# Patient Record
Sex: Female | Born: 1960 | Race: White | Hispanic: No | Smoking: Former smoker
Health system: Southern US, Community
[De-identification: ages and names within clinical notes are randomized; demographics above are authoritative.]

## PROBLEM LIST (undated history)

## (undated) DIAGNOSIS — K219 Gastro-esophageal reflux disease without esophagitis: Secondary | ICD-10-CM

## (undated) DIAGNOSIS — T8859XA Other complications of anesthesia, initial encounter: Secondary | ICD-10-CM

## (undated) DIAGNOSIS — R112 Nausea with vomiting, unspecified: Secondary | ICD-10-CM

## (undated) DIAGNOSIS — E119 Type 2 diabetes mellitus without complications: Secondary | ICD-10-CM

## (undated) DIAGNOSIS — Z9889 Other specified postprocedural states: Secondary | ICD-10-CM

## (undated) DIAGNOSIS — T4145XA Adverse effect of unspecified anesthetic, initial encounter: Secondary | ICD-10-CM

## (undated) DIAGNOSIS — Z8719 Personal history of other diseases of the digestive system: Secondary | ICD-10-CM

## (undated) DIAGNOSIS — F419 Anxiety disorder, unspecified: Secondary | ICD-10-CM

## (undated) HISTORY — PX: LAPAROSCOPIC ROUX-EN-Y GASTRIC BYPASS WITH UPPER ENDOSCOPY AND REMOVAL OF LAP BAND: SHX6505

## (undated) HISTORY — PX: LAPAROSCOPIC GASTRIC BAND REMOVAL WITH LAPAROSCOPIC GASTRIC SLEEVE RESECTION: SHX6498

## (undated) HISTORY — PX: ABDOMINAL HYSTERECTOMY: SHX81

## (undated) HISTORY — PX: TONSILLECTOMY: SUR1361

## (undated) HISTORY — PX: CHOLECYSTECTOMY: SHX55

---

## 2017-11-12 ENCOUNTER — Other Ambulatory Visit
Admission: RE | Admit: 2017-11-12 | Discharge: 2017-11-12 | Disposition: A | Discharge status: Home / Self Care | Payer: BLUE CROSS/BLUE SHIELD | Source: Ambulatory Visit | Attending: Endocrinology | Admitting: Endocrinology

## 2017-11-12 DIAGNOSIS — E039 Hypothyroidism, unspecified: Secondary | ICD-10-CM | POA: Insufficient documentation

## 2017-11-12 DIAGNOSIS — Z5181 Encounter for therapeutic drug level monitoring: Secondary | ICD-10-CM | POA: Insufficient documentation

## 2017-11-12 DIAGNOSIS — E785 Hyperlipidemia, unspecified: Secondary | ICD-10-CM | POA: Insufficient documentation

## 2017-11-12 DIAGNOSIS — E1149 Type 2 diabetes mellitus with other diabetic neurological complication: Secondary | ICD-10-CM | POA: Insufficient documentation

## 2017-11-12 DIAGNOSIS — Z79899 Other long term (current) drug therapy: Secondary | ICD-10-CM | POA: Insufficient documentation

## 2017-11-12 LAB — T4, FREE: FREE T4: 1.43 ng/dL — AB (ref 0.61–1.12)

## 2017-11-12 LAB — LIPID PANEL
Cholesterol: 208 mg/dL — ABNORMAL HIGH (ref 0–200)
HDL: 59 mg/dL (ref >40)
LDL CALC: 122 mg/dL — AB (ref 0–99)
TRIGLYCERIDES: 135 mg/dL (ref <150)
Total CHOL/HDL Ratio: 3.5 RATIO
VLDL: 27 mg/dL (ref 0–40)

## 2017-11-12 LAB — HEMOGLOBIN A1C
Hgb A1c MFr Bld: 6.1 % — ABNORMAL HIGH (ref 4.8–5.6)
Mean Plasma Glucose: 128.37 mg/dL

## 2017-11-12 LAB — TSH: TSH: 0.624 u[IU]/mL (ref 0.350–4.500)

## 2017-12-12 ENCOUNTER — Other Ambulatory Visit: Payer: Self-pay | Admitting: Family Medicine

## 2017-12-12 DIAGNOSIS — Z1231 Encounter for screening mammogram for malignant neoplasm of breast: Secondary | ICD-10-CM

## 2018-01-02 ENCOUNTER — Ambulatory Visit: Payer: BLUE CROSS/BLUE SHIELD

## 2018-01-07 ENCOUNTER — Ambulatory Visit
Admission: RE | Admit: 2018-01-07 | Discharge: 2018-01-07 | Disposition: A | Discharge status: Home / Self Care | Payer: BLUE CROSS/BLUE SHIELD | Source: Ambulatory Visit | Attending: Family Medicine | Admitting: Family Medicine

## 2018-01-07 ENCOUNTER — Encounter (HOSPITAL_COMMUNITY): Payer: Self-pay

## 2018-01-07 DIAGNOSIS — Z1231 Encounter for screening mammogram for malignant neoplasm of breast: Secondary | ICD-10-CM | POA: Diagnosis not present

## 2018-01-15 ENCOUNTER — Inpatient Hospital Stay
Admission: RE | Admit: 2018-01-15 | Discharge: 2018-01-15 | Disposition: A | Discharge status: Home / Self Care | Payer: Self-pay | Source: Ambulatory Visit | Attending: *Deleted | Admitting: *Deleted

## 2018-01-15 ENCOUNTER — Other Ambulatory Visit: Payer: Self-pay | Admitting: *Deleted

## 2018-01-15 DIAGNOSIS — Z9289 Personal history of other medical treatment: Secondary | ICD-10-CM

## 2018-01-25 ENCOUNTER — Other Ambulatory Visit: Payer: Self-pay | Admitting: Surgery

## 2018-01-25 DIAGNOSIS — Z903 Acquired absence of stomach [part of]: Secondary | ICD-10-CM

## 2018-02-01 ENCOUNTER — Ambulatory Visit
Admission: RE | Admit: 2018-02-01 | Discharge: 2018-02-01 | Disposition: A | Discharge status: Home / Self Care | Payer: BLUE CROSS/BLUE SHIELD | Source: Ambulatory Visit | Attending: Surgery | Admitting: Surgery

## 2018-02-01 DIAGNOSIS — Z903 Acquired absence of stomach [part of]: Secondary | ICD-10-CM

## 2018-04-23 ENCOUNTER — Other Ambulatory Visit: Payer: Self-pay | Admitting: Surgery

## 2018-04-23 DIAGNOSIS — R112 Nausea with vomiting, unspecified: Secondary | ICD-10-CM

## 2018-04-24 ENCOUNTER — Other Ambulatory Visit: Payer: Self-pay | Admitting: Surgery

## 2018-04-24 DIAGNOSIS — R112 Nausea with vomiting, unspecified: Secondary | ICD-10-CM

## 2018-04-30 ENCOUNTER — Ambulatory Visit
Admission: RE | Admit: 2018-04-30 | Discharge: 2018-04-30 | Disposition: A | Discharge status: Home / Self Care | Payer: BLUE CROSS/BLUE SHIELD | Source: Ambulatory Visit | Attending: Surgery | Admitting: Surgery

## 2018-04-30 DIAGNOSIS — K5730 Diverticulosis of large intestine without perforation or abscess without bleeding: Secondary | ICD-10-CM | POA: Insufficient documentation

## 2018-04-30 DIAGNOSIS — K449 Diaphragmatic hernia without obstruction or gangrene: Secondary | ICD-10-CM | POA: Insufficient documentation

## 2018-04-30 DIAGNOSIS — K760 Fatty (change of) liver, not elsewhere classified: Secondary | ICD-10-CM | POA: Insufficient documentation

## 2018-04-30 DIAGNOSIS — R112 Nausea with vomiting, unspecified: Secondary | ICD-10-CM | POA: Diagnosis present

## 2018-04-30 DIAGNOSIS — R590 Localized enlarged lymph nodes: Secondary | ICD-10-CM | POA: Diagnosis not present

## 2018-04-30 HISTORY — DX: Type 2 diabetes mellitus without complications: E11.9

## 2018-04-30 LAB — POCT I-STAT CREATININE: Creatinine, Ser: 0.8 mg/dL (ref 0.44–1.00)

## 2018-04-30 MED ORDER — IOPAMIDOL (ISOVUE-300) INJECTION 61%
100.0000 mL | Freq: Once | INTRAVENOUS | Status: AC | PRN
  Administered 2018-04-30: 100 mL via INTRAVENOUS

## 2018-05-17 ENCOUNTER — Other Ambulatory Visit (HOSPITAL_COMMUNITY): Payer: Self-pay | Admitting: Surgery

## 2018-05-17 ENCOUNTER — Other Ambulatory Visit: Payer: Self-pay | Admitting: Surgery

## 2018-05-17 DIAGNOSIS — Z903 Acquired absence of stomach [part of]: Secondary | ICD-10-CM

## 2018-05-27 ENCOUNTER — Ambulatory Visit
Admission: RE | Admit: 2018-05-27 | Discharge: 2018-05-27 | Disposition: A | Discharge status: Home / Self Care | Payer: BLUE CROSS/BLUE SHIELD | Source: Ambulatory Visit | Attending: Surgery | Admitting: Surgery

## 2018-05-27 DIAGNOSIS — Z903 Acquired absence of stomach [part of]: Secondary | ICD-10-CM | POA: Insufficient documentation

## 2018-05-27 DIAGNOSIS — R590 Localized enlarged lymph nodes: Secondary | ICD-10-CM | POA: Insufficient documentation

## 2018-05-27 MED ORDER — IOPAMIDOL (ISOVUE-300) INJECTION 61%
75.0000 mL | Freq: Once | INTRAVENOUS | Status: AC | PRN
  Administered 2018-05-27: 150 mL via INTRAVENOUS

## 2018-07-02 NOTE — H&P (Signed)
Chief Complaint:  Chronic nausea and/or vomiting  History of Present Illness:  Sierra Hall is an 57 y.o. female who had a lapband in 2007 with revision in 2009 and then a conversion to a sleeve in 2013 done elsewhere.  She has severe GER.  She has had a lap chole.  Presents for upper endoscopy.    Past Medical History:  Diagnosis Date  . Diabetes mellitus without complication ([place])     No past surgical history on file.  No current facility-administered medications for this encounter.    Current Outpatient Medications  Medication Sig Dispense Refill  . Ascorbic Acid (VITAMIN C) 100 MG tablet Take 100 mg by mouth daily.    Marland Kitchen b complex vitamins tablet Take 1 tablet by mouth daily.    . Biotin 1 MG CAPS Take 10,000 Units by mouth daily.    Marland Kitchen CARAFATE 1 GM/10ML suspension Take 10 mLs by mouth 2 (two) times daily.   0  . Cholecalciferol (VITAMIN D-1000 MAX ST) 1000 units tablet Take 10,000 Units by mouth daily.    . fenofibrate 160 MG tablet Take 160 mg by mouth daily.    . ferrous sulfate 325 (65 FE) MG EC tablet Take 325 mg by mouth daily.    . furosemide (LASIX) 20 MG tablet Take 20 mg by mouth daily as needed for edema.    Marland Kitchen glimepiride (AMARYL) 2 MG tablet Take 2 mg by mouth daily.    Marland Kitchen levothyroxine (SYNTHROID, LEVOTHROID) 137 MCG tablet Take 137 mcg by mouth daily before breakfast.    . magnesium oxide (MAG-OX) 400 MG tablet Take 400 mg by mouth daily.    . metFORMIN (GLUCOPHAGE-XR) 500 MG 24 hr tablet Take 500 mg by mouth 2 (two) times daily.    . Omega-3 1000 MG CAPS Take 1,000 mg by mouth 3 (three) times daily.    Marland Kitchen omeprazole (PRILOSEC) 40 MG capsule Take 40 mg by mouth 2 (two) times daily.    . Potassium 99 MG TABS Take 99 mg by mouth daily.    . vitamin B-12 (CYANOCOBALAMIN) 1000 MCG tablet Take 1,000 mcg by mouth daily.     Dilaudid [hydromorphone hcl]; Latex; Penicillin g; and Tape Family History  Problem Relation Age of Onset  . Breast cancer Neg Hx    Social  History:   has no tobacco, alcohol, and drug history on file.   REVIEW OF SYSTEMS : Negative   Physical Exam:   There were no vitals taken for this visit. There is no height or weight on file to calculate BMI.  Gen:  WDWN WF NAD  Neurological: Alert and oriented to person, place, and time. Motor and sensory function is grossly intact  Head: Normocephalic and atraumatic.  Eyes: Conjunctivae are normal. Pupils are equal, round, and reactive to light. No scleral icterus.  Neck: Normal range of motion. Neck supple. No tracheal deviation or thyromegaly present.  Cardiovascular:  SR without murmurs or gallops.  No carotid bruits Breast:  Not examined Respiratory: Effort normal.  No respiratory distress. No chest wall tenderness. Breath sounds normal.  No wheezes, rales or rhonchi.  Abdomen:  Non tender GU:  Not examined Musculoskeletal: Normal range of motion. Extremities are nontender. No cyanosis, edema or clubbing noted Lymphadenopathy: No cervical, preauricular, postauricular or axillary adenopathy is present Skin: Skin is warm and dry. No rash noted. No diaphoresis. No erythema. No pallor. Pscyh: Normal mood and affect. Behavior is normal. Judgment and thought content normal.  LABORATORY RESULTS: No results found for this or any previous visit (from the past 48 hour(s)).   RADIOLOGY RESULTS: No results found.  Problem List: There are no active problems to display for this patient.   Assessment & Plan: GER after sleeve for upper endoscopy    Matt B. Hassell Done, MD, [facility] [phone] beeper [phone]  07/02/2018 3:38 PM

## 2018-07-05 ENCOUNTER — Other Ambulatory Visit: Payer: Self-pay

## 2018-07-05 ENCOUNTER — Ambulatory Visit (HOSPITAL_COMMUNITY)
Admission: RE | Admit: 2018-07-05 | Discharge: 2018-07-05 | Disposition: A | Discharge status: Home / Self Care | Payer: BLUE CROSS/BLUE SHIELD | Source: Ambulatory Visit | Attending: Surgery | Admitting: Surgery

## 2018-07-05 ENCOUNTER — Ambulatory Visit (HOSPITAL_COMMUNITY): Payer: BLUE CROSS/BLUE SHIELD | Admitting: Anesthesiology

## 2018-07-05 ENCOUNTER — Encounter (HOSPITAL_COMMUNITY)
Admission: RE | Disposition: A | Discharge status: Home / Self Care | Payer: Self-pay | Source: Ambulatory Visit | Attending: Surgery

## 2018-07-05 ENCOUNTER — Encounter (HOSPITAL_COMMUNITY): Payer: Self-pay | Admitting: *Deleted

## 2018-07-05 DIAGNOSIS — E119 Type 2 diabetes mellitus without complications: Secondary | ICD-10-CM | POA: Insufficient documentation

## 2018-07-05 DIAGNOSIS — Z87891 Personal history of nicotine dependence: Secondary | ICD-10-CM | POA: Insufficient documentation

## 2018-07-05 DIAGNOSIS — R112 Nausea with vomiting, unspecified: Secondary | ICD-10-CM | POA: Diagnosis not present

## 2018-07-05 DIAGNOSIS — Z7984 Long term (current) use of oral hypoglycemic drugs: Secondary | ICD-10-CM | POA: Diagnosis not present

## 2018-07-05 DIAGNOSIS — K219 Gastro-esophageal reflux disease without esophagitis: Secondary | ICD-10-CM | POA: Insufficient documentation

## 2018-07-05 DIAGNOSIS — Z79890 Hormone replacement therapy (postmenopausal): Secondary | ICD-10-CM | POA: Diagnosis not present

## 2018-07-05 DIAGNOSIS — E039 Hypothyroidism, unspecified: Secondary | ICD-10-CM | POA: Diagnosis not present

## 2018-07-05 DIAGNOSIS — Z9884 Bariatric surgery status: Secondary | ICD-10-CM | POA: Diagnosis not present

## 2018-07-05 HISTORY — PX: ESOPHAGOGASTRODUODENOSCOPY (EGD) WITH PROPOFOL: SHX5813

## 2018-07-05 HISTORY — DX: Adverse effect of unspecified anesthetic, initial encounter: T41.45XA

## 2018-07-05 HISTORY — DX: Other complications of anesthesia, initial encounter: T88.59XA

## 2018-07-05 LAB — GLUCOSE, CAPILLARY: Glucose-Capillary: 104 mg/dL — ABNORMAL HIGH (ref 70–99)

## 2018-07-05 SURGERY — ESOPHAGOGASTRODUODENOSCOPY (EGD) WITH PROPOFOL
Anesthesia: Monitor Anesthesia Care

## 2018-07-05 MED ORDER — PROPOFOL 500 MG/50ML IV EMUL
INTRAVENOUS | Status: DC | PRN
  Administered 2018-07-05: 30 mg via INTRAVENOUS
  Administered 2018-07-05: 20 mg via INTRAVENOUS

## 2018-07-05 MED ORDER — MIDAZOLAM HCL 2 MG/2ML IJ SOLN
INTRAMUSCULAR | Status: AC
  Filled 2018-07-05: qty 2

## 2018-07-05 MED ORDER — ONDANSETRON HCL 4 MG/2ML IJ SOLN
INTRAMUSCULAR | Status: DC | PRN
  Administered 2018-07-05: 4 mg via INTRAVENOUS

## 2018-07-05 MED ORDER — LACTATED RINGERS IV SOLN
INTRAVENOUS | Status: DC
  Administered 2018-07-05: 07:00:00 via INTRAVENOUS

## 2018-07-05 MED ORDER — PROPOFOL 10 MG/ML IV BOLUS
INTRAVENOUS | Status: AC
  Filled 2018-07-05: qty 60

## 2018-07-05 MED ORDER — SCOPOLAMINE 1 MG/3DAYS TD PT72
1.0000 | MEDICATED_PATCH | TRANSDERMAL | Status: DC
  Administered 2018-07-05: 1.5 mg via TRANSDERMAL

## 2018-07-05 MED ORDER — MIDAZOLAM HCL 5 MG/5ML IJ SOLN
INTRAMUSCULAR | Status: DC | PRN
  Administered 2018-07-05: 2 mg via INTRAVENOUS

## 2018-07-05 MED ORDER — SCOPOLAMINE 1 MG/3DAYS TD PT72
MEDICATED_PATCH | TRANSDERMAL | Status: AC
  Filled 2018-07-05: qty 1

## 2018-07-05 MED ORDER — PROPOFOL 500 MG/50ML IV EMUL
INTRAVENOUS | Status: DC | PRN
  Administered 2018-07-05: 100 ug/kg/min via INTRAVENOUS

## 2018-07-05 MED ORDER — CHLORHEXIDINE GLUCONATE CLOTH 2 % EX PADS: 6.0000 | MEDICATED_PAD | Freq: Once | CUTANEOUS | Status: DC

## 2018-07-05 NOTE — Transfer of Care (Signed)
Immediate Anesthesia Transfer of Care Note  Patient: Sierra Hall  Procedure(s) Performed: Procedure(s): ESOPHAGOGASTRODUODENOSCOPY (EGD) WITH PROPOFOL (N/A)  Patient Location: PACU  Anesthesia Type:MAC  Level of Consciousness:  sedated, patient cooperative and responds to stimulation  Airway & Oxygen Therapy:Patient Spontanous Breathing and Patient connected to face mask oxgen  Post-op Assessment:  Report given to PACU RN and Post -op Vital signs reviewed and stable  Post vital signs:  Reviewed and stable  Last Vitals:  Vitals:   07/05/18 0629  BP: 116/60  Pulse: 60  Resp: 12  Temp: 36.6 C  SpO2: 389%    Complications: No apparent anesthesia complications

## 2018-07-05 NOTE — Anesthesia Postprocedure Evaluation (Signed)
Anesthesia Post Note  Patient: Sierra Hall  Procedure(s) Performed: ESOPHAGOGASTRODUODENOSCOPY (EGD) WITH PROPOFOL (N/A )     Patient location during evaluation: Endoscopy Anesthesia Type: MAC Level of consciousness: awake Pain management: pain level controlled Vital Signs Assessment: post-procedure vital signs reviewed and stable Respiratory status: spontaneous breathing Cardiovascular status: stable Postop Assessment: no apparent nausea or vomiting Anesthetic complications: no    Last Vitals:  Vitals:   07/05/18 0810 07/05/18 0820  BP: (!) 95/51 (!) 107/50  Pulse: (!) 56 (!) 52  Resp: 19 14  Temp:    SpO2: 98% 100%    Last Pain:  Vitals:   07/05/18 0629  TempSrc: Oral  PainSc:    Pain Goal:                 Atanacio Melnyk Barron Alvine

## 2018-07-05 NOTE — Anesthesia Preprocedure Evaluation (Addendum)
Anesthesia Evaluation    History of Anesthesia Complications (+) PONV, AWARENESS UNDER ANESTHESIA and history of anesthetic complications  Airway Mallampati: I       Dental no notable dental hx. (+) Teeth Intact   Pulmonary former smoker,    Pulmonary exam normal breath sounds clear to auscultation       Cardiovascular Normal cardiovascular exam Rhythm:Regular Rate:Normal     Neuro/Psych    GI/Hepatic negative GI ROS, Neg liver ROS,   Endo/Other  diabetes, Type 2, Oral Hypoglycemic AgentsHypothyroidism   Renal/GU negative Renal ROS     Musculoskeletal   Abdominal Normal abdominal exam  (+)   Peds  Hematology   Anesthesia Other Findings   Reproductive/Obstetrics                            Anesthesia Physical Anesthesia Plan  ASA: II  Anesthesia Plan: MAC   Post-op Pain Management:    Induction:   PONV Risk Score and Plan: Ondansetron, Dexamethasone and Scopolamine patch - Pre-op  Airway Management Planned: Natural Airway, Nasal Cannula and Simple Face Mask  Additional Equipment:   Intra-op Plan:   Post-operative Plan:   Informed Consent: I have reviewed the patients History and Physical, chart, labs and discussed the procedure including the risks, benefits and alternatives for the proposed anesthesia with the patient or authorized representative who has indicated his/her understanding and acceptance.   Dental advisory given  Plan Discussed with: CRNA  Anesthesia Plan Comments:         Anesthesia Quick Evaluation

## 2018-07-05 NOTE — Interval H&P Note (Signed)
History and Physical Interval Note:  07/05/2018 7:32 AM  Sierra Hall  has presented today for surgery, with the diagnosis of POST SLEEVE HIATAL HERNIA AND WALL THICKENING  The various methods of treatment have been discussed with the patient and family. After consideration of risks, benefits and other options for treatment, the patient has consented to  Procedure(s): ESOPHAGOGASTRODUODENOSCOPY (EGD) WITH PROPOFOL (N/A) as a surgical intervention .  The patient's history has been reviewed, patient examined, no change in status, stable for surgery.  I have reviewed the patient's chart and labs.  Questions were answered to the patient's satisfaction.     [name]

## 2018-07-05 NOTE — Discharge Instructions (Signed)
Esophagogastroduodenoscopy, Care After °Refer to this sheet in the next few weeks. These instructions provide you with information about caring for yourself after your procedure. Your health care provider may also give you more specific instructions. Your treatment has been planned according to current medical practices, but problems sometimes occur. Call your health care provider if you have any problems or questions after your procedure. °What can I expect after the procedure? °After the procedure, it is common to have: °· A sore throat. °· Nausea. °· Bloating. °· Dizziness. °· Fatigue. ° °Follow these instructions at home: °· Do not eat or drink anything until the numbing medicine (local anesthetic) has worn off and your gag reflex has returned. You will know that the local anesthetic has worn off when you can swallow comfortably. °· Do not drive for 24 hours if you received a medicine to help you relax (sedative). °· If your health care provider took a tissue sample for testing during the procedure, make sure to get your test results. This is your responsibility. Ask your health care provider or the department performing the test when your results will be ready. °· Keep all follow-up visits as told by your health care provider. This is important. °Contact a health care provider if: °· You cannot stop coughing. °· You are not urinating. °· You are urinating less than usual. °Get help right away if: °· You have trouble swallowing. °· You cannot eat or drink. °· You have throat or chest pain that gets worse. °· You are dizzy or light-headed. °· You faint. °· You have nausea or vomiting. °· You have chills. °· You have a fever. °· You have severe abdominal pain. °· You have black, tarry, or bloody stools. °This information is not intended to replace advice given to you by your health care provider. Make sure you discuss any questions you have with your health care provider. °Document Released: 10/09/2012 Document  Revised: 03/30/2016 Document Reviewed: 09/16/2015 °Elsevier Interactive Patient Education © 2018 [employer] ° °

## 2018-07-05 NOTE — Op Note (Signed)
Sierra Hall  10/25/1961 05 July 2018   PCP:  Derinda Late, MD   Surgeon: Kaylyn Lim, MD, FACS  Asst:  none  Anes:  IV sedation in endoscopy  Preop Dx: Post prior bariatric procedures (lapband x2 and conversion to sleeve gastrectomy in [place]) now with GER and nausea/vomiting Postop Dx: No hiatal hernia noted;  Some retained food in proximal sleeve;  No esophagitis, normal appearing mucosa in the sleeve  Procedure: Esophagogastrostomy with biopsy Location Surgery: [facility] Endo 4 Complications: none  EBL:   none cc  Drains: none  Description of Procedure:  The patient was taken to Endo 4 . The endoscope was inserted into the mouth with the bite guard in place and passed into the proximal esophagus without difficulty.  Initial inspection revealed small amount of amorphous food in the proximal sleeve.  The EG junction at 40 cm did not have a hiatal hernia noted.  The mucosa did not appear thickened.  The sleeve appeared cylindrical and easily passed to the antrum which did have some red stria.  Clo bx done in the antrum.  Motility noted to be normal.  The scope was withdrawn to the proximal sleeve where two biopsies were taken of the mucosa ( in light of report concerning thickening on CT).  Minimal bleeding after biopsy.  The scope was then withdrawn and she tolerated the procedure well.     Matt B. Hassell Done, [place], [facility], [place]

## 2018-07-06 LAB — CLOTEST (H. PYLORI), BIOPSY: HELICOBACTER SCREEN: NEGATIVE

## 2018-07-09 ENCOUNTER — Encounter (HOSPITAL_COMMUNITY): Payer: Self-pay | Admitting: Surgery

## 2018-07-31 ENCOUNTER — Ambulatory Visit: Payer: Self-pay | Admitting: Surgery

## 2018-11-14 ENCOUNTER — Ambulatory Visit (HOSPITAL_COMMUNITY): Payer: BLUE CROSS/BLUE SHIELD | Admitting: Anesthesiology

## 2018-11-14 ENCOUNTER — Other Ambulatory Visit: Payer: Self-pay

## 2018-11-14 ENCOUNTER — Ambulatory Visit (HOSPITAL_COMMUNITY)
Admission: RE | Admit: 2018-11-14 | Discharge: 2018-11-14 | Disposition: A | Discharge status: Home / Self Care | Payer: BLUE CROSS/BLUE SHIELD | Source: Ambulatory Visit | Attending: Surgery | Admitting: Surgery

## 2018-11-14 ENCOUNTER — Encounter (HOSPITAL_COMMUNITY)
Admission: RE | Disposition: A | Discharge status: Home / Self Care | Payer: Self-pay | Source: Ambulatory Visit | Attending: Surgery

## 2018-11-14 DIAGNOSIS — K644 Residual hemorrhoidal skin tags: Secondary | ICD-10-CM | POA: Diagnosis not present

## 2018-11-14 DIAGNOSIS — Z9884 Bariatric surgery status: Secondary | ICD-10-CM | POA: Insufficient documentation

## 2018-11-14 DIAGNOSIS — K219 Gastro-esophageal reflux disease without esophagitis: Secondary | ICD-10-CM | POA: Diagnosis not present

## 2018-11-14 DIAGNOSIS — Z86010 Personal history of colonic polyps: Secondary | ICD-10-CM | POA: Insufficient documentation

## 2018-11-14 DIAGNOSIS — Z885 Allergy status to narcotic agent status: Secondary | ICD-10-CM | POA: Diagnosis not present

## 2018-11-14 DIAGNOSIS — E119 Type 2 diabetes mellitus without complications: Secondary | ICD-10-CM | POA: Insufficient documentation

## 2018-11-14 DIAGNOSIS — Z880 Allergy status to penicillin: Secondary | ICD-10-CM | POA: Insufficient documentation

## 2018-11-14 DIAGNOSIS — K6389 Other specified diseases of intestine: Secondary | ICD-10-CM | POA: Insufficient documentation

## 2018-11-14 DIAGNOSIS — Z87891 Personal history of nicotine dependence: Secondary | ICD-10-CM | POA: Diagnosis not present

## 2018-11-14 DIAGNOSIS — D125 Benign neoplasm of sigmoid colon: Secondary | ICD-10-CM | POA: Insufficient documentation

## 2018-11-14 DIAGNOSIS — Z800 Family history of malignant neoplasm of digestive organs: Secondary | ICD-10-CM | POA: Insufficient documentation

## 2018-11-14 DIAGNOSIS — K5730 Diverticulosis of large intestine without perforation or abscess without bleeding: Secondary | ICD-10-CM | POA: Insufficient documentation

## 2018-11-14 HISTORY — PX: COLONOSCOPY: SHX5424

## 2018-11-14 HISTORY — PX: BIOPSY: SHX5522

## 2018-11-14 HISTORY — PX: POLYPECTOMY: SHX5525

## 2018-11-14 LAB — GLUCOSE, CAPILLARY: Glucose-Capillary: 112 mg/dL — ABNORMAL HIGH (ref 70–99)

## 2018-11-14 SURGERY — COLONOSCOPY
Anesthesia: Monitor Anesthesia Care

## 2018-11-14 MED ORDER — PROPOFOL 10 MG/ML IV BOLUS
INTRAVENOUS | Status: AC
  Filled 2018-11-14: qty 20

## 2018-11-14 MED ORDER — PROPOFOL 10 MG/ML IV BOLUS
INTRAVENOUS | Status: AC
  Filled 2018-11-14: qty 40

## 2018-11-14 MED ORDER — LACTATED RINGERS IV SOLN
INTRAVENOUS | Status: DC
  Administered 2018-11-14 (×2): via INTRAVENOUS

## 2018-11-14 MED ORDER — PROPOFOL 10 MG/ML IV BOLUS
INTRAVENOUS | Status: DC | PRN
  Administered 2018-11-14: 20 mg via INTRAVENOUS
  Administered 2018-11-14: 40 mg via INTRAVENOUS
  Administered 2018-11-14 (×4): 20 mg via INTRAVENOUS

## 2018-11-14 MED ORDER — SODIUM CHLORIDE 0.9 % IV SOLN: INTRAVENOUS | Status: DC

## 2018-11-14 MED ORDER — PROPOFOL 500 MG/50ML IV EMUL
INTRAVENOUS | Status: DC | PRN
  Administered 2018-11-14: 100 ug/kg/min via INTRAVENOUS

## 2018-11-14 NOTE — Anesthesia Postprocedure Evaluation (Signed)
Anesthesia Post Note  Patient: Sierra Hall  Procedure(s) Performed: COLONOSCOPY (N/A ) POLYPECTOMY BIOPSY     Patient location during evaluation: PACU Anesthesia Type: MAC Level of consciousness: awake and alert Pain management: pain level controlled Vital Signs Assessment: post-procedure vital signs reviewed and stable Respiratory status: spontaneous breathing, nonlabored ventilation, respiratory function stable and patient connected to nasal cannula oxygen Cardiovascular status: stable and blood pressure returned to baseline Postop Assessment: no apparent nausea or vomiting Anesthetic complications: no    Last Vitals:  Vitals:   11/14/18 0931 11/14/18 0940  BP: (!) 136/51 (!) 117/55  Pulse: (!) 55 62  Resp: 14 19  Temp: 36.4 C   SpO2: 99% 99%    Last Pain:  Vitals:   11/14/18 0940  TempSrc:   PainSc: 0-No pain                 Andrey Mccaskill S

## 2018-11-14 NOTE — Anesthesia Preprocedure Evaluation (Signed)
Anesthesia Evaluation  Patient identified by MRN, date of birth, ID band Patient awake    Reviewed: Allergy & Precautions, NPO status , Patient's Chart, lab work & pertinent test results  Airway Mallampati: II  TM Distance: >3 FB Neck ROM: Full    Dental no notable dental hx.    Pulmonary neg pulmonary ROS, former smoker,    Pulmonary exam normal breath sounds clear to auscultation       Cardiovascular negative cardio ROS Normal cardiovascular exam Rhythm:Regular Rate:Normal     Neuro/Psych negative neurological ROS  negative psych ROS   GI/Hepatic negative GI ROS, Neg liver ROS,   Endo/Other  diabetes  Renal/GU negative Renal ROS  negative genitourinary   Musculoskeletal negative musculoskeletal ROS (+)   Abdominal   Peds negative pediatric ROS (+)  Hematology negative hematology ROS (+)   Anesthesia Other Findings   Reproductive/Obstetrics negative OB ROS                             Anesthesia Physical Anesthesia Plan  ASA: II  Anesthesia Plan: MAC   Post-op Pain Management:    Induction: Intravenous  PONV Risk Score and Plan:   Airway Management Planned: Simple Face Mask  Additional Equipment:   Intra-op Plan:   Post-operative Plan:   Informed Consent: I have reviewed the patients History and Physical, chart, labs and discussed the procedure including the risks, benefits and alternatives for the proposed anesthesia with the patient or authorized representative who has indicated his/her understanding and acceptance.   Dental advisory given  Plan Discussed with: CRNA and Surgeon  Anesthesia Plan Comments:         Anesthesia Quick Evaluation

## 2018-11-14 NOTE — Op Note (Signed)
[facility] Patient Name: Sierra Hall Procedure Date: 11/14/2018 MRN: [id number] Attending MD: Ileana Roup MD, MD Date of Birth: 18-Sep-1961 CSN: [id number] Age: 58 Admit Type: Outpatient Procedure:                Colonoscopy Indications:              Generalized abdominal pain, Personal history of                            colonic polyps, Change in stool caliber Providers:                Sharon Mt. Roberto Hlavaty MD, MD, Elmer Ramp. Tilden Dome, RN,                            Cherylynn Ridges, Technician, Anne Fu CRNA,                            CRNA Referring MD:              Medicines:                Monitored Anesthesia Care Complications:            No immediate complications. Estimated Blood Loss:     Estimated blood loss was minimal. Procedure:                Pre-Anesthesia Assessment:                           - Prior to the procedure, a History and Physical                            was performed, and patient medications, allergies                            and sensitivities were reviewed. The patient's                            tolerance of previous anesthesia was reviewed.                           - The risks and benefits of the procedure and the                            sedation options and risks were discussed with the                            patient. All questions were answered and informed                            consent was obtained.                           - Patient identification and proposed procedure                            were verified prior to the  procedure by the                            physician, the nurse, the anesthesiologist and the                            anesthetist. The procedure was verified in the                            pre-procedure area in the endoscopy suite.                           - ASA Grade Assessment: II - A patient with mild                            systemic disease.    - The anesthesia plan was to use monitored                            anesthesia care (MAC).                           After obtaining informed consent, the colonoscope                            was passed under direct vision. Throughout the                            procedure, the patient's blood pressure, pulse, and                            oxygen saturations were monitored continuously. The                            CF-HQ190L ([id number]) Olympus Adult Colonoscope was                            introduced through the anus and advanced to the the                            cecum, identified by the appendiceal orifice, IC                            valve and transillumination. The colonoscopy was                            somewhat difficult due to a redundant colon with                            significant looping of the sigmoid colon.                            Successful completion of the procedure was aided by  applying gentle abdominal pressure. The patient                            tolerated the procedure well. The quality of the                            bowel preparation was adequate. Scope In: 8:13:32 AM Scope Out: 9:23:32 AM Scope Withdrawal Time: 0 hours 26 minutes 16 seconds  Total Procedure Duration: 1 hour 10 minutes 0 seconds  Findings:      Hemorrhoids were found on perianal exam.      Skin tags were found on perianal exam.      Multiple small and large-mouthed diverticula were found in the sigmoid       colon.      An area of mildly congested mucosa was found throughout in the sigmoid       colon.      A 4 mm polyp was found in the sigmoid colon. The polyp was semi-sessile.       The polyp was removed with a cold snare. Resection and retrieval were       complete. Verification of patient identification for the specimen was       done using the patient's name, birth date and medical record number.       Estimated blood loss was  minimal.      Multiple biopsies were obtained with cold forceps for histology randomly       in the sigmoid colon, in the descending colon, in the transverse colon       and in the ascending colon. Estimated blood loss: none.      The exam was otherwise without abnormality on direct and retroflexion       views. Impression:               - Hemorrhoids found on perianal exam.                           - Perianal skin tags found on perianal exam.                           - Diverticulosis in the sigmoid colon.                           - Congested mucosa in the sigmoid colon.                           - One 4 mm polyp in the sigmoid colon, removed with                            a cold snare. Resected and retrieved.                           - The examination was otherwise normal on direct                            and retroflexion views.                           -  Multiple biopsies were obtained in the sigmoid                            colon, in the descending colon, in the transverse                            colon and in the ascending colon. Moderate Sedation:      Not Applicable - Patient had care per Anesthesia. Recommendation:           - Discharge patient to home.                           - High fiber diet indefinitely.                           - No aspirin, ibuprofen, naproxen, or other                            non-steroidal anti-inflammatory drugs for 10 days                            after polyp removal.                           - Await pathology results.                           - Repeat colonoscopy in 3 - 5 years for                            surveillance based on pathology results.                           - Return to referring physician at appointment to                            be scheduled. Procedure Code(s):        --- Professional ---                           [phone], Colonoscopy, flexible; with removal of                            tumor(s), polyp(s), or  other lesion(s) by snare                            technique                           [id number], 67, Colonoscopy, flexible; with biopsy,                            single or multiple Diagnosis Code(s):        --- Professional ---  K63.89, Other specified diseases of intestine                           K64.9, Unspecified hemorrhoids                           D12.5, Benign neoplasm of sigmoid colon                           K64.4, Residual hemorrhoidal skin tags                           R10.84, Generalized abdominal pain                           Z86.010, Personal history of colonic polyps                           R19.5, Other fecal abnormalities                           K57.30, Diverticulosis of large intestine without                            perforation or abscess without bleeding CPT copyright 2018 American [employer] Association. All rights reserved. The codes documented in this report are preliminary and upon coder review may  be revised to meet current compliance requirements. Nadeen Landau, MD Ileana Roup MD, MD 11/14/2018 9:34:34 AM This report has been signed electronically. Number of Addenda: 0

## 2018-11-14 NOTE — Transfer of Care (Signed)
Immediate Anesthesia Transfer of Care Note  Patient: Sierra Hall  Procedure(s) Performed: Procedure(s): COLONOSCOPY (N/A) POLYPECTOMY BIOPSY  Patient Location: PACU  Anesthesia Type:MAC  Level of Consciousness:  sedated, patient cooperative and responds to stimulation  Airway & Oxygen Therapy:Patient Spontanous Breathing and Patient connected to face mask oxgen  Post-op Assessment:  Report given to PACU RN and Post -op Vital signs reviewed and stable  Post vital signs:  Reviewed and stable  Last Vitals:  Vitals:   11/14/18 0725  BP: 115/65  Pulse: 62  Resp: 13  Temp: 36.6 C  SpO2: 073%    Complications: No apparent anesthesia complications

## 2018-11-14 NOTE — H&P (Addendum)
CC: Here today for diagnostic colonoscopy  HPI: EVALENA FUJII is an 58 y.o. female with hx of DM, morbid obesity (now s/p sleeve gastrectomy in [place]) presents for diagnostic colonoscopy. She has been followed in the clinic by my partner Dr. Hassell Done. She has a hx of sleeve gastrectomy with what sounds like hiatal hernia repair and abdominoplasty in [place]. She had a good response with regards to weight loss. She subsequently moved here and has been following with Dr. Hassell Done for her bariatric care. She reports a 1-15yr hx of intermittent bloating and narrow caliber stool. She has hx of GERD as well. She denies any blood in her stool. She was referred for colonoscopy given her symptoms  She underwent EGD with Dr. Hassell Done 06/2018 - relatively normal - biopsies returned benign gastric tissue without H. pylori  She reports having had a colonoscopy ~65yrs ago for personal hx of polyps but being told this was normal - no polyps removed. She believes she had her first colonoscopy ~45-47yo as she has a sister that was diagnosed with colon cancer in her 49s at that time. She reports having been told she had some small polyps that were removed at her first scope.   Past Medical History:  Diagnosis Date  . Complication of anesthesia    woke up during surgery   . Diabetes mellitus without complication [facility])     Past Surgical History:  Procedure Laterality Date  . ESOPHAGOGASTRODUODENOSCOPY (EGD) WITH PROPOFOL N/A 07/05/2018   Procedure: ESOPHAGOGASTRODUODENOSCOPY (EGD) WITH PROPOFOL;  Surgeon: Johnathan Hausen, MD;  Location: [facility];  Service: General;  Laterality: N/A;    Family History  Problem Relation Age of Onset  . Breast cancer Neg Hx   Sister with colon cancer in her 67s; reports her and all her sxs have been screened and not had cancer. Denies any other FHx of malignancy.  Social:  reports that she has quit smoking. She has never used smokeless tobacco. She reports current alcohol use.  She reports that she does not use drugs.  Allergies:  Allergies  Allergen Reactions  . Dilaudid [Hydromorphone Hcl] Nausea Only  . Latex Rash  . Penicillin G Rash and Other (See Comments)    Has patient had a PCN reaction causing immediate rash, facial/tongue/throat swelling, SOB or lightheadedness with hypotension: Yes Has patient had a PCN reaction causing severe rash involving mucus membranes or skin necrosis: No Has patient had a PCN reaction that required hospitalization: Yes (pt was already hospitalized at the time) Has patient had a PCN reaction occurring within the last 10 years: Yes If all of the above answers are "NO", then may proceed with Cephalosporin use.   . Tape Rash    Medications: I have reviewed the patient's current medications.  Results for orders placed or performed during the hospital encounter of 11/14/18 (from the past 48 hour(s))  Glucose, capillary     Status: Abnormal   Collection Time: 11/14/18  7:51 AM  Result Value Ref Range   Glucose-Capillary 112 (H) 70 - 99 mg/dL    No results found.  ROS - all of the below systems have been reviewed with the patient and positives are indicated with bold text General: chills, fever or night sweats Eyes: blurry vision or double vision ENT: epistaxis or sore throat Allergy/Immunology: itchy/watery eyes or nasal congestion Hematologic/Lymphatic: bleeding problems, blood clots or swollen lymph nodes Endocrine: temperature intolerance or unexpected weight changes Breast: new or changing breast lumps or nipple discharge Resp: cough, shortness  of breath, or wheezing CV: chest pain or dyspnea on exertion GI: as per HPI GU: dysuria, trouble voiding, or hematuria MSK: joint pain or joint stiffness Neuro: TIA or stroke symptoms Derm: pruritus and skin lesion changes Psych: anxiety and depression  PE Blood pressure 115/65, pulse 62, temperature 97.9 F (36.6 C), temperature source Oral, resp. rate 13, height 5\' 7"   (1.702 m), weight 72.6 kg, SpO2 100 %. Constitutional: NAD; conversant; no deformities Eyes: Moist conjunctiva; no lid lag; anicteric; PERRL Neck: Trachea midline; no thyromegaly Lungs: Normal respiratory effort; no tactile fremitus CV: RRR; no palpable thrills; no pitting edema GI: Abd soft, NT/ND; no palpable hepatosplenomegaly MSK: Normal gait; no clubbing/cyanosis Psychiatric: Appropriate affect; alert and oriented x3 Lymphatic: No palpable cervical or axillary lymphadenopathy  Results for orders placed or performed during the hospital encounter of 11/14/18 (from the past 48 hour(s))  Glucose, capillary     Status: Abnormal   Collection Time: 11/14/18  7:51 AM  Result Value Ref Range   Glucose-Capillary 112 (H) 70 - 99 mg/dL    No results found.  A/P: NORETA KUE is an 58 y.o. female with intermittent bloating and narrow caliber stool - here for diagnostic colonoscopy for this purpose  -The anatomy and physiology of the GI tract was discussed at length with the patient. The pathophysiology of colon polyps and masses was discussed at length as well. -We discussed colonoscopy and the technical aspects therein, material risks (including, but not limited to, pain, bleeding, need for blood transfusion, damage to surrounding structures- colon, spleen, liver, perforation of GI tract, need for additional procedures, need for stoma, worsening of pre-existing medical conditions, recurrence of polyps, pneumonia, heart attack, stroke, death) benefits and alternatives to colonoscopy were discussed at length. The patient's questions were answered to her satisfaction, she voiced understanding and elected to proceed with the procedure.  Sharon Mt. Dema Severin, M.D. General and Colorectal Surgery [facility]

## 2018-11-14 NOTE — Discharge Instructions (Signed)
Colonoscopy, Adult, Care After °This sheet gives you information about how to care for yourself after your procedure. Your doctor may also give you more specific instructions. If you have problems or questions, call your doctor. °What can I expect after the procedure? °After the procedure, it is common to have: °· A small amount of blood in your poop for 24 hours. °· Some gas. °· Mild cramping or bloating in your belly. °Follow these instructions at home: °General instructions °· For the first 24 hours after the procedure: °? Do not drive or use machinery. °? Do not sign important documents. °? Do not drink alcohol. °? Do your daily activities more slowly than normal. °? Eat foods that are soft and easy to digest. °· Take over-the-counter or prescription medicines only as told by your doctor. °To help cramping and bloating: ° °· Try walking around. °· Put heat on your belly (abdomen) as told by your doctor. Use a heat source that your doctor recommends, such as a moist heat pack or a heating pad. °? Put a towel between your skin and the heat source. °? Leave the heat on for 20-30 minutes. °? Remove the heat if your skin turns bright red. This is especially important if you cannot feel pain, heat, or cold. You can get burned. °Eating and drinking ° °· Drink enough fluid to keep your pee (urine) clear or pale yellow. °· Return to your normal diet as told by your doctor. Avoid heavy or fried foods that are hard to digest. °· Avoid drinking alcohol for as long as told by your doctor. °Contact a doctor if: °· You have blood in your poop (stool) 2-3 days after the procedure. °Get help right away if: °· You have more than a small amount of blood in your poop. °· You see large clumps of tissue (blood clots) in your poop. °· Your belly is swollen. °· You feel sick to your stomach (nauseous). °· You throw up (vomit). °· You have a fever. °· You have belly pain that gets worse, and medicine does not help your  pain. °Summary °· After the procedure, it is common to have a small amount of blood in your poop. You may also have mild cramping and bloating in your belly. °· For the first 24 hours after the procedure, do not drive or use machinery, do not sign important documents, and do not drink alcohol. °· Get help right away if you have a lot of blood in your poop, feel sick to your stomach, have a fever, or have more belly pain. °This information is not intended to replace advice given to you by your health care provider. Make sure you discuss any questions you have with your health care provider. °Document Released: 11/25/2010 Document Revised: 08/23/2017 Document Reviewed: 07/17/2016 °Elsevier Interactive Patient Education © 2019 [employer] ° °

## 2018-11-15 ENCOUNTER — Encounter (HOSPITAL_COMMUNITY): Payer: Self-pay | Admitting: Surgery

## 2018-12-18 ENCOUNTER — Other Ambulatory Visit: Payer: Self-pay | Admitting: Orthopaedic Surgery

## 2018-12-25 ENCOUNTER — Encounter (HOSPITAL_COMMUNITY)
Admission: RE | Admit: 2018-12-25 | Discharge: 2018-12-25 | Disposition: A | Discharge status: Home / Self Care | Payer: BLUE CROSS/BLUE SHIELD | Source: Ambulatory Visit | Attending: Orthopaedic Surgery | Admitting: Orthopaedic Surgery

## 2018-12-25 ENCOUNTER — Other Ambulatory Visit: Payer: Self-pay

## 2018-12-25 ENCOUNTER — Encounter (HOSPITAL_COMMUNITY): Payer: Self-pay

## 2018-12-25 DIAGNOSIS — Z01818 Encounter for other preprocedural examination: Secondary | ICD-10-CM | POA: Diagnosis not present

## 2018-12-25 HISTORY — DX: Personal history of other diseases of the digestive system: Z87.19

## 2018-12-25 HISTORY — DX: Anxiety disorder, unspecified: F41.9

## 2018-12-25 LAB — BASIC METABOLIC PANEL
Anion gap: 10 (ref 5–15)
BUN: 18 mg/dL (ref 6–20)
CO2: 28 mmol/L (ref 22–32)
Calcium: 9.8 mg/dL (ref 8.9–10.3)
Chloride: 102 mmol/L (ref 98–111)
Creatinine, Ser: 1.1 mg/dL — ABNORMAL HIGH (ref 0.44–1.00)
GFR calc Af Amer: >60 mL/min (ref >60)
GFR calc non Af Amer: 56 mL/min — ABNORMAL LOW (ref >60)
Glucose, Bld: 138 mg/dL — ABNORMAL HIGH (ref 70–99)
Potassium: 4.2 mmol/L (ref 3.5–5.1)
Sodium: 140 mmol/L (ref 135–145)

## 2018-12-25 LAB — CBC
HCT: 41.7 % (ref 36.0–46.0)
Hemoglobin: 13 g/dL (ref 12.0–15.0)
MCH: 28.4 pg (ref 26.0–34.0)
MCHC: 31.2 g/dL (ref 30.0–36.0)
MCV: 91.2 fL (ref 80.0–100.0)
NRBC: 0 % (ref 0.0–0.2)
Platelets: 333 10*3/uL (ref 150–400)
RBC: 4.57 MIL/uL (ref 3.87–5.11)
RDW: 12.8 % (ref 11.5–15.5)
WBC: 8.6 10*3/uL (ref 4.0–10.5)

## 2018-12-25 LAB — GLUCOSE, CAPILLARY: Glucose-Capillary: 129 mg/dL — ABNORMAL HIGH (ref 70–99)

## 2018-12-25 NOTE — Pre-Procedure Instructions (Signed)
[facility]  12/25/2018      [id number] [place], [place] [address] [name] [address] Phone: [phone] Fax: [phone]    Your procedure is scheduled on 12/26/18.  Report to [facility] Admitting at 1030 A.M.  Call this number if you have problems the morning of surgery:  [phone]   Remember:  Do not eat or drink after midnight.      Take these medicines the morning of surgery with A SIP OF WATER ----synthroid,prilosec    Do not wear jewelry, make-up or nail polish.  Do not wear lotions, powders, or perfumes, or deodorant.  Do not shave 48 hours prior to surgery.  Men may shave face and neck.  Do not bring valuables to the hospital.  [facility] is not responsible for any belongings or valuables.  Contacts, dentures or bridgework may not be worn into surgery.  Leave your suitcase in the car.  After surgery it may be brought to your room.  For patients admitted to the hospital, discharge time will be determined by your treatment team.  Patients discharged the day of surgery will not be allowed to drive home.   Name and phone number of your driver:   Do not take any aspirin,anti-inflammatories,vitamins,or herbal supplements 5-7 days prior to surgery. Special instructions:  [facility] - Preparing for Surgery  Before surgery, you can play an important role.  Because skin is not sterile, your skin needs to be as free of germs as possible.  You can reduce the number of germs on you skin by washing with CHG (chlorahexidine gluconate) soap before surgery.  CHG is an antiseptic cleaner which kills germs and bonds with the skin to continue killing germs even after washing.  Oral Hygiene is also important in reducing the risk of infection.  Remember to brush your teeth with your regular toothpaste the morning of surgery.  Please DO NOT use if you have an allergy to CHG or antibacterial soaps.  If your skin becomes  reddened/irritated stop using the CHG and inform your nurse when you arrive at Short Stay.  Do not shave (including legs and underarms) for at least 48 hours prior to the first CHG shower.  You may shave your face.  Please follow these instructions carefully:   1.  Shower with CHG Soap the night before surgery and the morning of Surgery.  2.  If you choose to wash your hair, wash your hair first as usual with your normal shampoo.  3.  After you shampoo, rinse your hair and body thoroughly to remove the shampoo. 4.  Use CHG as you would any other liquid soap.  You can apply chg directly to the skin and wash gently with a      scrungie or washcloth.           5.  Apply the CHG Soap to your body ONLY FROM THE NECK DOWN.   Do not use on open wounds or open sores. Avoid contact with your eyes, ears, mouth and genitals (private parts).  Wash genitals (private parts) with your normal soap.  6.  Wash thoroughly, paying special attention to the area where your surgery will be performed.  7.  Thoroughly rinse your body with warm water from the neck down.  8.  DO NOT shower/wash with your normal soap after using and rinsing off the CHG Soap.  9.  Pat yourself dry with a clean towel.  10.  Wear clean pajamas.            11.  Place clean sheets on your bed the night of your first shower and do not sleep with pets.  Day of Surgery  Do not apply any lotions/deoderants the morning of surgery.   Please wear clean clothes to the hospital/surgery center. Remember to brush your teeth with toothpaste.    Please read over the following fact sheets that you were given.    How to Manage Your Diabetes Before and After Surgery  Why is it important to control my blood sugar before and after surgery? . Improving blood sugar levels before and after surgery helps healing and can limit problems. . A way of improving blood sugar control is eating a healthy diet by: o  Eating less sugar and  carbohydrates o  Increasing activity/exercise o  Talking with your doctor about reaching your blood sugar goals . High blood sugars (greater than 180 mg/dL) can raise your risk of infections and slow your recovery, so you will need to focus on controlling your diabetes during the weeks before surgery. . Make sure that the doctor who takes care of your diabetes knows about your planned surgery including the date and location.  How do I manage my blood sugar before surgery? . Check your blood sugar at least 4 times a day, starting 2 days before surgery, to make sure that the level is not too high or low. o Check your blood sugar the morning of your surgery when you wake up and every 2 hours until you get to the Short Stay unit. . If your blood sugar is less than 70 mg/dL, you will need to treat for low blood sugar: o Do not take insulin. o Treat a low blood sugar (less than 70 mg/dL) with  cup of clear juice (cranberry or apple), 4 glucose tablets, OR glucose gel. Recheck blood sugar in 15 minutes after treatment (to make sure it is greater than 70 mg/dL). If your blood sugar is not greater than 70 mg/dL on recheck, call [phone] o  for further instructions. . Report your blood sugar to the short stay nurse when you get to Short Stay.  . If you are admitted to the hospital after surgery: o Your blood sugar will be checked by the staff and you will probably be given insulin after surgery (instead of oral diabetes medicines) to make sure you have good blood sugar levels. o The goal for blood sugar control after surgery is 80-180 mg/dL.              WHAT DO I DO ABOUT MY DIABETES MEDICATION?   Marland Kitchen Do not take oral diabetes medicines (pills) the morning of surgery.  . THE NIGHT BEFORE SURGERY, take ___________ units of ___________insulin.       . THE MORNING OF SURGERY, take _____________ units of __________insulin.  . The day of surgery, do not take other diabetes injectables,  including Byetta (exenatide), Bydureon (exenatide ER), Victoza (liraglutide), or Trulicity (dulaglutide).  . If your CBG is greater than 220 mg/dL, you may take  of your sliding scale (correction) dose of insulin.  Other Instructions:          Patient Signature:  Date:   Nurse Signature:  Date:   Reviewed and Endorsed by [facility] Patient Education Committee, August 2015

## 2018-12-26 ENCOUNTER — Ambulatory Visit (HOSPITAL_COMMUNITY)
Admission: RE | Admit: 2018-12-26 | Discharge: 2018-12-26 | Disposition: A | Discharge status: Home / Self Care | Payer: BLUE CROSS/BLUE SHIELD | Attending: Orthopaedic Surgery | Admitting: Orthopaedic Surgery

## 2018-12-26 ENCOUNTER — Encounter (HOSPITAL_COMMUNITY): Payer: Self-pay | Admitting: *Deleted

## 2018-12-26 ENCOUNTER — Ambulatory Visit (HOSPITAL_COMMUNITY): Payer: BLUE CROSS/BLUE SHIELD | Admitting: Certified Registered"

## 2018-12-26 ENCOUNTER — Encounter (HOSPITAL_COMMUNITY)
Admission: RE | Disposition: A | Discharge status: Home / Self Care | Payer: Self-pay | Source: Home / Self Care | Attending: Orthopaedic Surgery

## 2018-12-26 DIAGNOSIS — Z79899 Other long term (current) drug therapy: Secondary | ICD-10-CM | POA: Diagnosis not present

## 2018-12-26 DIAGNOSIS — M2011 Hallux valgus (acquired), right foot: Secondary | ICD-10-CM | POA: Diagnosis not present

## 2018-12-26 DIAGNOSIS — Z885 Allergy status to narcotic agent status: Secondary | ICD-10-CM | POA: Insufficient documentation

## 2018-12-26 DIAGNOSIS — M21621 Bunionette of right foot: Secondary | ICD-10-CM | POA: Diagnosis not present

## 2018-12-26 DIAGNOSIS — Z79890 Hormone replacement therapy (postmenopausal): Secondary | ICD-10-CM | POA: Diagnosis not present

## 2018-12-26 DIAGNOSIS — K219 Gastro-esophageal reflux disease without esophagitis: Secondary | ICD-10-CM | POA: Diagnosis not present

## 2018-12-26 DIAGNOSIS — M7741 Metatarsalgia, right foot: Secondary | ICD-10-CM | POA: Diagnosis not present

## 2018-12-26 DIAGNOSIS — Z7984 Long term (current) use of oral hypoglycemic drugs: Secondary | ICD-10-CM | POA: Diagnosis not present

## 2018-12-26 DIAGNOSIS — Z87891 Personal history of nicotine dependence: Secondary | ICD-10-CM | POA: Insufficient documentation

## 2018-12-26 DIAGNOSIS — E119 Type 2 diabetes mellitus without complications: Secondary | ICD-10-CM | POA: Insufficient documentation

## 2018-12-26 DIAGNOSIS — Z880 Allergy status to penicillin: Secondary | ICD-10-CM | POA: Insufficient documentation

## 2018-12-26 HISTORY — PX: BUNIONECTOMY WITH WEIL OSTEOTOMY: SHX5604

## 2018-12-26 LAB — GLUCOSE, CAPILLARY
GLUCOSE-CAPILLARY: 132 mg/dL — AB (ref 70–99)
GLUCOSE-CAPILLARY: 106 mg/dL — AB (ref 70–99)

## 2018-12-26 LAB — HEMOGLOBIN A1C
Hgb A1c MFr Bld: 6.1 % — ABNORMAL HIGH (ref 4.8–5.6)
Mean Plasma Glucose: 128 mg/dL

## 2018-12-26 SURGERY — BUNIONECTOMY WITH WEIL OSTEOTOMY
Anesthesia: Monitor Anesthesia Care | Site: Foot | Laterality: Right

## 2018-12-26 MED ORDER — PROPOFOL 500 MG/50ML IV EMUL
INTRAVENOUS | Status: DC | PRN
  Administered 2018-12-26: 75 ug/kg/min via INTRAVENOUS

## 2018-12-26 MED ORDER — BUPIVACAINE HCL (PF) 0.5 % IJ SOLN
INTRAMUSCULAR | Status: DC | PRN
  Administered 2018-12-26: 30 mL

## 2018-12-26 MED ORDER — ONDANSETRON HCL 4 MG PO TABS: 4.0000 mg | ORAL_TABLET | Freq: Three times a day (TID) | ORAL | 1 refills | Status: AC | PRN

## 2018-12-26 MED ORDER — FENTANYL CITRATE (PF) 100 MCG/2ML IJ SOLN
INTRAMUSCULAR | Status: AC
  Filled 2018-12-26: qty 2

## 2018-12-26 MED ORDER — FENTANYL CITRATE (PF) 250 MCG/5ML IJ SOLN
INTRAMUSCULAR | Status: AC
  Filled 2018-12-26: qty 5

## 2018-12-26 MED ORDER — PROPOFOL 10 MG/ML IV BOLUS
INTRAVENOUS | Status: DC | PRN
  Administered 2018-12-26: 40 mg via INTRAVENOUS
  Administered 2018-12-26: 30 mg via INTRAVENOUS
  Administered 2018-12-26: 20 mg via INTRAVENOUS

## 2018-12-26 MED ORDER — PROPOFOL 10 MG/ML IV BOLUS
INTRAVENOUS | Status: AC
  Filled 2018-12-26: qty 20

## 2018-12-26 MED ORDER — CLINDAMYCIN PHOSPHATE 900 MG/50ML IV SOLN
900.0000 mg | INTRAVENOUS | Status: AC
  Administered 2018-12-26: 900 mg via INTRAVENOUS

## 2018-12-26 MED ORDER — ROCURONIUM BROMIDE 50 MG/5ML IV SOSY
PREFILLED_SYRINGE | INTRAVENOUS | Status: AC
  Filled 2018-12-26: qty 5

## 2018-12-26 MED ORDER — CLINDAMYCIN PHOSPHATE 900 MG/50ML IV SOLN
INTRAVENOUS | Status: AC
  Filled 2018-12-26: qty 50

## 2018-12-26 MED ORDER — EPHEDRINE SULFATE-NACL 50-0.9 MG/10ML-% IV SOSY
PREFILLED_SYRINGE | INTRAVENOUS | Status: DC | PRN
  Administered 2018-12-26: 5 mg via INTRAVENOUS
  Administered 2018-12-26: 10 mg via INTRAVENOUS

## 2018-12-26 MED ORDER — ONDANSETRON HCL 4 MG/2ML IJ SOLN
INTRAMUSCULAR | Status: DC | PRN
  Administered 2018-12-26: 4 mg via INTRAVENOUS

## 2018-12-26 MED ORDER — LIDOCAINE 2% (20 MG/ML) 5 ML SYRINGE
INTRAMUSCULAR | Status: AC
  Filled 2018-12-26: qty 5

## 2018-12-26 MED ORDER — DEXAMETHASONE SODIUM PHOSPHATE 4 MG/ML IJ SOLN
INTRAMUSCULAR | Status: DC | PRN
  Administered 2018-12-26: 8 mg via INTRAVENOUS

## 2018-12-26 MED ORDER — FENTANYL CITRATE (PF) 100 MCG/2ML IJ SOLN: INTRAMUSCULAR | Status: DC | PRN

## 2018-12-26 MED ORDER — FENTANYL CITRATE (PF) 100 MCG/2ML IJ SOLN
50.0000 ug | Freq: Once | INTRAMUSCULAR | Status: AC
  Administered 2018-12-26: 50 ug via INTRAVENOUS

## 2018-12-26 MED ORDER — LIDOCAINE 2% (20 MG/ML) 5 ML SYRINGE
INTRAMUSCULAR | Status: DC | PRN
  Administered 2018-12-26: 50 mg via INTRAVENOUS

## 2018-12-26 MED ORDER — 0.9 % SODIUM CHLORIDE (POUR BTL) OPTIME
TOPICAL | Status: DC | PRN
  Administered 2018-12-26: 1000 mL

## 2018-12-26 MED ORDER — PHENYLEPHRINE 40 MCG/ML (10ML) SYRINGE FOR IV PUSH (FOR BLOOD PRESSURE SUPPORT)
PREFILLED_SYRINGE | INTRAVENOUS | Status: AC
  Filled 2018-12-26: qty 10

## 2018-12-26 MED ORDER — PROMETHAZINE HCL 25 MG/ML IJ SOLN: 6.2500 mg | INTRAMUSCULAR | Status: DC | PRN

## 2018-12-26 MED ORDER — FENTANYL CITRATE (PF) 100 MCG/2ML IJ SOLN: 25.0000 ug | INTRAMUSCULAR | Status: DC | PRN

## 2018-12-26 MED ORDER — FENTANYL CITRATE (PF) 250 MCG/5ML IJ SOLN
INTRAMUSCULAR | Status: DC | PRN
  Administered 2018-12-26: 50 ug via INTRAVENOUS
  Administered 2018-12-26: 25 ug via INTRAVENOUS
  Administered 2018-12-26 (×3): 50 ug via INTRAVENOUS
  Administered 2018-12-26: 25 ug via INTRAVENOUS

## 2018-12-26 MED ORDER — MIDAZOLAM HCL 5 MG/5ML IJ SOLN
INTRAMUSCULAR | Status: DC | PRN
  Administered 2018-12-26: 2 mg via INTRAVENOUS

## 2018-12-26 MED ORDER — LACTATED RINGERS IV SOLN
INTRAVENOUS | Status: DC
  Administered 2018-12-26: 12:00:00 via INTRAVENOUS

## 2018-12-26 MED ORDER — SCOPOLAMINE 1 MG/3DAYS TD PT72
MEDICATED_PATCH | TRANSDERMAL | Status: AC
  Filled 2018-12-26: qty 1

## 2018-12-26 MED ORDER — LACTATED RINGERS IV SOLN
INTRAVENOUS | Status: DC | PRN
  Administered 2018-12-26: 12:00:00 via INTRAVENOUS

## 2018-12-26 MED ORDER — MIDAZOLAM HCL 2 MG/2ML IJ SOLN
INTRAMUSCULAR | Status: AC
  Filled 2018-12-26: qty 2

## 2018-12-26 MED ORDER — OXYCODONE HCL 5 MG/5ML PO SOLN: 5.0000 mg | Freq: Once | ORAL | Status: DC | PRN

## 2018-12-26 MED ORDER — CHLORHEXIDINE GLUCONATE 4 % EX LIQD: 60.0000 mL | Freq: Once | CUTANEOUS | Status: DC

## 2018-12-26 MED ORDER — DEXAMETHASONE SODIUM PHOSPHATE 10 MG/ML IJ SOLN
INTRAMUSCULAR | Status: AC
  Filled 2018-12-26: qty 1

## 2018-12-26 MED ORDER — OXYCODONE HCL 5 MG PO TABS: 5.0000 mg | ORAL_TABLET | Freq: Once | ORAL | Status: DC | PRN

## 2018-12-26 MED ORDER — SCOPOLAMINE 1 MG/3DAYS TD PT72
1.0000 | MEDICATED_PATCH | TRANSDERMAL | Status: DC
  Administered 2018-12-26: 1.5 mg via TRANSDERMAL

## 2018-12-26 MED ORDER — ACETAMINOPHEN 10 MG/ML IV SOLN: 1000.0000 mg | Freq: Once | INTRAVENOUS | Status: DC | PRN

## 2018-12-26 MED ORDER — MIDAZOLAM HCL 2 MG/2ML IJ SOLN
2.0000 mg | Freq: Once | INTRAMUSCULAR | Status: AC
  Administered 2018-12-26: 2 mg via INTRAVENOUS

## 2018-12-26 MED ORDER — ONDANSETRON HCL 4 MG/2ML IJ SOLN
INTRAMUSCULAR | Status: AC
  Filled 2018-12-26: qty 2

## 2018-12-26 MED ORDER — ONDANSETRON HCL 4 MG PO TABS
4.0000 mg | ORAL_TABLET | Freq: Once | ORAL | Status: AC
  Administered 2018-12-26: 4 mg via ORAL
  Filled 2018-12-26: qty 1

## 2018-12-26 MED ORDER — OXYCODONE HCL 5 MG PO TABS: 5.0000 mg | ORAL_TABLET | ORAL | 0 refills | Status: AC | PRN

## 2018-12-26 MED ORDER — EPHEDRINE 5 MG/ML INJ
INTRAVENOUS | Status: AC
  Filled 2018-12-26: qty 10

## 2018-12-26 SURGICAL SUPPLY — 67 items
ALCOHOL 70% 16 OZ (MISCELLANEOUS) ×3 IMPLANT
BANDAGE ESMARK 6X9 LF (GAUZE/BANDAGES/DRESSINGS) ×1 IMPLANT
BIT DRILL 100X2XQC STRL (BIT) ×1 IMPLANT
BIT DRILL PROS QC 1.5 (BIT) ×2 IMPLANT
BIT DRILL PROS QC 1.5MM (BIT) ×1
BIT DRILL QC 2.0X100 (BIT) ×2
BIT DRL 100X2XQC STRL (BIT) ×1
BLADE AVERAGE 25MMX9MM (BLADE) ×1
BLADE AVERAGE 25X9 (BLADE) ×2 IMPLANT
BLADE OSCILLATING/SAGITTAL (BLADE) ×2
BLADE SURG 15 STRL LF DISP TIS (BLADE) ×1 IMPLANT
BLADE SURG 15 STRL SS (BLADE) ×2
BLADE SW THK.38XMED NAR THN (BLADE) ×1 IMPLANT
BNDG COHESIVE 4X5 TAN STRL (GAUZE/BANDAGES/DRESSINGS) ×3 IMPLANT
BNDG COHESIVE 6X5 TAN STRL LF (GAUZE/BANDAGES/DRESSINGS) ×3 IMPLANT
BNDG CONFORM 2 STRL LF (GAUZE/BANDAGES/DRESSINGS) ×3 IMPLANT
BNDG ELASTIC 2X5.8 VLCR STR LF (GAUZE/BANDAGES/DRESSINGS) ×3 IMPLANT
BNDG ESMARK 4X9 LF (GAUZE/BANDAGES/DRESSINGS) ×3 IMPLANT
BNDG ESMARK 6X9 LF (GAUZE/BANDAGES/DRESSINGS) ×3
CANISTER SUCT 3000ML PPV (MISCELLANEOUS) ×3 IMPLANT
CHLORAPREP W/TINT 26ML (MISCELLANEOUS) ×6 IMPLANT
COVER SURGICAL LIGHT HANDLE (MISCELLANEOUS) ×3 IMPLANT
COVER WAND RF STERILE (DRAPES) ×3 IMPLANT
CUFF TOURNIQUET SINGLE 34IN LL (TOURNIQUET CUFF) IMPLANT
CUFF TOURNIQUET SINGLE 44IN (TOURNIQUET CUFF) IMPLANT
DRAPE OEC MINIVIEW 54X84 (DRAPES) ×3 IMPLANT
DRAPE U-SHAPE 47X51 STRL (DRAPES) ×3 IMPLANT
DRSG MEPITEL 4X7.2 (GAUZE/BANDAGES/DRESSINGS) ×3 IMPLANT
DRSG PAD ABDOMINAL 8X10 ST (GAUZE/BANDAGES/DRESSINGS) ×6 IMPLANT
DRSG XEROFORM 1X8 (GAUZE/BANDAGES/DRESSINGS) ×3 IMPLANT
ELECT REM PT RETURN 9FT ADLT (ELECTROSURGICAL) ×3
ELECTRODE REM PT RTRN 9FT ADLT (ELECTROSURGICAL) ×1 IMPLANT
GAUZE SPONGE 4X4 12PLY STRL (GAUZE/BANDAGES/DRESSINGS) ×3 IMPLANT
GLOVE BIOGEL M STRL SZ7.5 (GLOVE) ×3 IMPLANT
GLOVE BIOGEL PI IND STRL 8 (GLOVE) ×1 IMPLANT
GLOVE BIOGEL PI INDICATOR 8 (GLOVE) ×2
GLOVE ECLIPSE 8.0 STRL XLNG CF (GLOVE) ×3 IMPLANT
GOWN STRL REUS W/ TWL LRG LVL3 (GOWN DISPOSABLE) ×1 IMPLANT
GOWN STRL REUS W/ TWL XL LVL3 (GOWN DISPOSABLE) ×2 IMPLANT
GOWN STRL REUS W/TWL LRG LVL3 (GOWN DISPOSABLE) ×2
GOWN STRL REUS W/TWL XL LVL3 (GOWN DISPOSABLE) ×4
KIT BASIN OR (CUSTOM PROCEDURE TRAY) ×3 IMPLANT
KIT TURNOVER KIT B (KITS) ×3 IMPLANT
NS IRRIG 1000ML POUR BTL (IV SOLUTION) ×3 IMPLANT
PACK ORTHO EXTREMITY (CUSTOM PROCEDURE TRAY) ×3 IMPLANT
PAD ARMBOARD 7.5X6 YLW CONV (MISCELLANEOUS) ×6 IMPLANT
PAD CAST 4YDX4 CTTN HI CHSV (CAST SUPPLIES) ×1 IMPLANT
PADDING CAST COTTON 4X4 STRL (CAST SUPPLIES) ×2
PADDING CAST SYNTHETIC 4 (CAST SUPPLIES) ×2
PADDING CAST SYNTHETIC 4X4 STR (CAST SUPPLIES) ×1 IMPLANT
SCREW CORTEX 2.0X12 (Screw) ×3 IMPLANT
SCREW CORTEX 2.0X14 (Screw) ×3 IMPLANT
SCREW CORTEX 2.7 18M (Screw) ×3 IMPLANT
SCREW CORTEX ST 2.0X20 (Screw) ×3 IMPLANT
SPONGE LAP 18X18 RF (DISPOSABLE) ×3 IMPLANT
SUCTION FRAZIER HANDLE 10FR (MISCELLANEOUS) ×2
SUCTION TUBE FRAZIER 10FR DISP (MISCELLANEOUS) ×1 IMPLANT
SUT ETHILON 3 0 PS 1 (SUTURE) ×3 IMPLANT
SUT FIBERWIRE 2-0 18 17.9 3/8 (SUTURE) ×3
SUT MNCRL AB 3-0 PS2 18 (SUTURE) IMPLANT
SUT VIC AB 2-0 CT1 27 (SUTURE) ×4
SUT VIC AB 2-0 CT1 TAPERPNT 27 (SUTURE) ×2 IMPLANT
SUTURE FIBERWR 2-0 18 17.9 3/8 (SUTURE) ×1 IMPLANT
TOWEL OR 17X24 6PK STRL BLUE (TOWEL DISPOSABLE) ×3 IMPLANT
TOWEL OR 17X26 10 PK STRL BLUE (TOWEL DISPOSABLE) ×3 IMPLANT
TUBE CONNECTING 12'X1/4 (SUCTIONS) ×1
TUBE CONNECTING 12X1/4 (SUCTIONS) ×2 IMPLANT

## 2018-12-26 NOTE — Progress Notes (Signed)
Orthopedic Tech Progress Note Patient Details:  [place] [facility] 1961-06-09 [id number] OR RN called requesting a cam walker Ortho Devices Type of Ortho Device: CAM walker Ortho Device/Splint Interventions: Other (comment)   Post Interventions Patient Tolerated: Other (comment) Instructions Provided: Other (comment)   Janit Pagan 12/26/2018, 1:29 PM

## 2018-12-26 NOTE — Anesthesia Procedure Notes (Addendum)
Procedure Name: MAC Date/Time: 12/26/2018 12:17 PM Performed by: Orlie Dakin, CRNA Pre-anesthesia Checklist: Patient identified, Emergency Drugs available and Suction available Patient Re-evaluated:Patient Re-evaluated prior to induction Oxygen Delivery Method: Simple face mask Preoxygenation: Pre-oxygenation with 100% oxygen Induction Type: IV induction

## 2018-12-26 NOTE — Discharge Instructions (Signed)
DR. Lucia Gaskins FOOT & ANKLE SURGERY POST-OP INSTRUCTIONS   Pain Management 1. The numbing medicine and your leg will last around 8 hours, take a dose of your pain medicine as soon as you feel it wearing off to avoid rebound pain. 2. Keep your foot elevated above heart level.  Make sure that your heel hangs free ('floats'). 3. Take all prescribed medication as directed. 4. If taking narcotic pain medication you may want to use an over-the-counter stool softener to avoid constipation. 5. You may take over-the-counter NSAIDs (ibuprofen, naproxen, etc.) as well as over-the-counter acetaminophen as directed on the packaging as a supplement for your pain and may also use it to wean away from the prescription medication.  Activity ? Heel weightbearing as tolerated in a boot ?  ? Postoperatively, you will be placed into a splint which stays on for 2 weeks and then will be changed at your first postop visit.  First Postoperative Visit 1. Your first postop visit will be at least 2 weeks after surgery.  This should be scheduled when you schedule surgery. 2. If you do not have a postoperative visit scheduled please call [phone] to schedule an appointment. 3. At the appointment your incision will be evaluated for suture removal, x-rays will be obtained if necessary.  General Instructions 1. Swelling is very common after foot and ankle surgery.  It often takes 3 months for the foot and ankle to begin to feel comfortable.  Some amount of swelling will persist for 6-12 months. 2. DO NOT change the dressing.  If there is a problem with the dressing (too tight, loose, gets wet, etc.) please contact Dr. Pollie Friar office. 3. DO NOT get the dressing wet.  For showers you can use an over-the-counter cast cover or wrap a washcloth around the top of your dressing and then cover it with a plastic bag and tape it to your leg. 4. DO NOT soak the incision (no tubs, pools, bath, etc.) until you have approval from Dr.  Lucia Gaskins.  Contact Dr. Huel Cote office or go to Emergency Room if: 1. Temperature above 101 F. 2. Increasing pain that is unresponsive to pain medication or elevation 3. Excessive redness or swelling in your foot 4. Dressing problems - excessive bloody drainage, looseness or tightness, or if dressing gets wet 5. Develop pain, swelling, warmth, or discoloration of your calf

## 2018-12-26 NOTE — Anesthesia Preprocedure Evaluation (Addendum)
Anesthesia Evaluation  Patient identified by MRN, date of birth, ID band Patient awake    Reviewed: Allergy & Precautions, NPO status , Patient's Chart, lab work & pertinent test results  History of Anesthesia Complications (+) AWARENESS UNDER ANESTHESIA and history of anesthetic complications  Airway Mallampati: II  TM Distance: >3 FB Neck ROM: Full    Dental  (+) Teeth Intact, Dental Advisory Given   Pulmonary former smoker,    Pulmonary exam normal breath sounds clear to auscultation       Cardiovascular negative cardio ROS Normal cardiovascular exam Rhythm:Regular Rate:Normal     Neuro/Psych Anxiety negative neurological ROS     GI/Hepatic Neg liver ROS, hiatal hernia, GERD  Medicated,  Endo/Other  diabetes, Type 2, Oral Hypoglycemic AgentsHypothyroidism   Renal/GU negative Renal ROS     Musculoskeletal negative musculoskeletal ROS (+)   Abdominal   Peds  Hematology negative hematology ROS (+)   Anesthesia Other Findings Day of surgery medications reviewed with the patient.  Reproductive/Obstetrics                            Anesthesia Physical Anesthesia Plan  ASA: II  Anesthesia Plan: MAC and Regional   Post-op Pain Management:  Regional for Post-op pain   Induction:   PONV Risk Score and Plan: 2 and Treatment may vary due to age or medical condition, Propofol infusion, Ondansetron and Scopolamine patch - Pre-op  Airway Management Planned: Natural Airway and Simple Face Mask  Additional Equipment:   Intra-op Plan:   Post-operative Plan:   Informed Consent: I have reviewed the patients History and Physical, chart, labs and discussed the procedure including the risks, benefits and alternatives for the proposed anesthesia with the patient or authorized representative who has indicated his/her understanding and acceptance.     Dental advisory given  Plan Discussed  with: CRNA  Anesthesia Plan Comments:        Anesthesia Quick Evaluation

## 2018-12-26 NOTE — Anesthesia Procedure Notes (Signed)
Anesthesia Regional Block: Ankle block   Pre-Anesthetic Checklist: ,, timeout performed, Correct Patient, Correct Site, Correct Laterality, Correct Procedure, Correct Position, site marked, Risks and benefits discussed, pre-op evaluation,  At surgeon's request and post-op pain management  Laterality: Right  Prep: Maximum Sterile Barrier Precautions used, chloraprep       Needles:  Injection technique: Single-shot  Needle Type: Echogenic Needle     Needle Length: 4cm  Needle Gauge: 25     Additional Needles:   (landmark technique)  Narrative:  Start time: 12/26/2018 11:29 AM End time: 12/26/2018 11:32 AM Injection made incrementally with aspirations every 5 mL.  Performed by: Personally  Anesthesiologist: Brennan Bailey, MD  Additional Notes: Risks, benefits, and alternative discussed. Patient gave consent for procedure. Patient prepped and draped in sterile fashion. Sedation administered, patient remains easily responsive to voice. Local anesthetic given in 5cc increments with no signs or symptoms of intravascular injection. No pain or paraesthesias with injection. Patient monitored throughout procedure with signs of LAST or immediate complications. Tolerated well.  Tawny Asal, MD

## 2018-12-26 NOTE — Op Note (Signed)
NICOLET GRIFFY female 58 y.o. 12/26/2018  PreOperative Diagnosis: Right hallux valgus Right bunionette Metatarsalgia Hallux valgus interphalangeus, right.  PostOperative Diagnosis: Same  PROCEDURE: Right modified McBride bunionectomy Right first metatarsal and Aiken osteotomy Right fifth metatarsal osteotomy and bunionette ectomy Right fifth metatarsal head bursectomy   SURGEON: Melony Overly, MD  ASSISTANT: Levada Dy, RNFA  ANESTHESIA: MAC with peripheral nerve block  FINDINGS: Hallux valgus with displaced sesamoids Bunionette Hallux valgus interphalangeus Sesamoid articulation chondrosis  IMPLANTS: Synthes 2.7 millimeters screw Synthes 2.0 millimeter screw x2  INDICATIONS:57 y.o. female has been dealing with pain in her bunion and bunionette for several months.  This is been recalcitrant to conservative treatment in the form of shoe modifications, activity modifications, anti-inflammatories as well as icing.  She had pain along the medial eminence as well as pain along the bunionette area.  She also had metatarsalgia type pain.  On evaluation she did not have much lesser toe deformity and most of her deformity was within the bunion and the bunionette.  She was indicated for distal chevron and Akin osteotomy as well as bunionette excision with fifth metatarsal osteotomy.  She understood the risk, benefits alternatives of surgery which were discussed.  These risks include but are not limited to wound healing complications, infection, nonunion, malunion, need for further surgery, continued pain, recurrence of her bunion or bunionette.  We also discussed the possibility of continued metatarsalgia pain as well as the development of hammertoes.  After weighing these risks she wished to proceed.  We also discussed the perioperative and anesthetic risk which included death as well as the perioperative weightbearing restrictions which she understood and agreed to comply  to.  PROCEDURE: Patient was identified in the preoperative holding area.  The right foot was marked by myself.  Consent was signed by myself and the patient.  Peripheral nerve block was performed by anesthesia.  She was taken to the operative suite placed supine on the operative table.  Preoperative antibiotics were given.  MAC anesthesia was induced without difficulty.  The right lower extremity was prepped and draped in the usual sterile fashion.  Surgical timeout was performed.  A 4 inch Esmarch ankle tourniquet was placed.  We began by making a longitudinal incision on the medial eminence of the first metatarsal and proximal phalanx.  This taken sharply down through skin subcutaneous tissues and flaps were created.  The dorsal medial cutaneous branch was identified and protected through the entirety of the case.  This was the capsular tissue was then identified and incised sharply down to bone.  Capsular flaps were created and there was a large medial eminence noted.  Care was taken to maintain good capsular thickness.  The first MTP joint was identified and inspected.  There was no evidence of significant chondrosis or arthritis.  Then using a sagittal saw the medial eminence was removed leaving a cuff of bone to avoid hallux varus complication.  Then a distal chevron osteotomy was created and held in place with a pointed reduction forcep.  Appropriate sesamoidal reduction was confirmed on fluoroscopy and found to be inadequate.  Then using a Soil scientist between the metatarsal head and the sesamoidal articulation I was able to identify the medial capsular tissue and this was incised sharply with a 15 blade.  Then the sesamoidal articulation was was inspected and found to have cartilage wear on the sesamoid side.  Then the osteotomy site of the metatarsal head was re-reduced and x-ray fluoroscopy confirmed appropriate sesamoidal reduction.  Then  a 2 7 screw was placed to fix the osteotomy site.  Then the  capsular tissue was closed and there was noted to be a significant amount of hallux valgus interphalangeus and there is still touching between the second and first toe.  Therefore the incision was extended distally and the proximal phalanx was identified.  Subperiosteal flaps were created proximally leaving capsular tissue attached to the bone.  Then baby Hohmann's were placed dorsal and plantar on the proximal phalanx and an Aiken osteotomy was created.  This provided good correction of the deformity.  A 2 oh millimeter screw was placed and had good fixation.  Then the first MTP capsular tissue was closed and imbricated in a pants over vest fashion using a 2-0 FiberWire stitch.  X-rays confirmed acceptable position of the hallux MTP joint and correction of her bunion.  We then turned our attention to the fifth metatarsal.  The skin overlying the lateral aspect of the fifth metatarsal phalangeal joint was incised sharply down through skin and subcutaneous tissue.  Then blunt dissection was used to mobilize the skin nerves.  Then the capsular tissue was identified and incised longitudinally over the joint.  The metatarsal head and neck region was subperiosteally dissected.  There is a large amount of bursal tissue in this area that was resected sharply with a 15 blade.  Then using a sagittal saw a long chevron osteotomy was created at the fifth metatarsal neck.  The deformity was then corrected and the osteotomy site was fixed with a 2 oh millimeter screw.  The remaining bony prominence from the osteotomy shift was removed with a sagittal saw.  Sharp bony prominences were removed.  Then the capsular tissue overlying the fifth MTP joint was imbricated using a 2-0 FiberWire stitch.  Each wound was then irrigated copiously with normal saline.  Fluoroscopic imaging confirmed good position of the fifth metatarsal head and first metatarsal head and Aiken osteotomy site.  Bunion correction was acceptable.  Fifth metatarsal  correction was acceptable.  The subcuticular tissue was closed with a 3-0 Monocryl and the skin with 3-0 nylon.  She was then placed in a bunion dressing of Xeroform, 4 x 4's, 2 inch Kling wrap and she cotton and Ace wrap.  She was placed in a walking boot.  She tolerated the procedure well.  There were no complications.  She was awakened from anesthesia and taken recovery in stable condition.  All counts were correct at the end of the case.  POST OPERATIVE INSTRUCTIONS: Heel weightbearing to right lower extremity. Keep dressing dry and intact Call the office with concerns No need for DVT prophylaxis in this ambulatory patient She will follow-up in 2 weeks for dressing removal, suture removal, nonweightbearing x-rays and replacement of a bunion dressing.  TOURNIQUET TIME: Unknown  BLOOD LOSS:  less than 50 mL         DRAINS: none         SPECIMEN: none       COMPLICATIONS:  * No complications entered in OR log *         Disposition: PACU - hemodynamically stable.         Condition: stable

## 2018-12-26 NOTE — Transfer of Care (Signed)
Immediate Anesthesia Transfer of Care Note  Patient: Sierra Hall  Procedure(s) Performed: RIGHT FOOT BUNION CORRECTION, BUNIONETTE CORRECTION, AKIN OSTEOTOMY (Right Foot)  Patient Location: PACU  Anesthesia Type:MAC and Regional  Level of Consciousness: awake, oriented and patient cooperative  Airway & Oxygen Therapy: Patient Spontanous Breathing and Patient connected to face mask oxygen  Post-op Assessment: Report given to RN, Post -op Vital signs reviewed and stable and Patient moving all extremities X 4  Post vital signs: Reviewed and stable  Last Vitals:  Vitals Value Taken Time  BP 112/60 12/26/2018  2:00 PM  Temp    Pulse 68 12/26/2018  1:58 PM  Resp 14 12/26/2018  2:01 PM  SpO2 81 % 12/26/2018  1:58 PM  Vitals shown include unvalidated device data.  Last Pain:  Vitals:   12/26/18 1114  TempSrc:   PainSc: 2          Complications: No apparent anesthesia complications

## 2018-12-26 NOTE — H&P (Signed)
Sierra Hall is an 58 y.o. female.   Chief Complaint: Right bunion, bunionette and metatarsalgia HPI: Sierra Hall is here today for surgery.  She was seen in the office and diagnosed with a bunion as well as a bunionette.  She is having significant amount of metatarsalgia without significant lesser toe deformities.  Given her difficulty with shoe wear and failure of nonoperative treatment she was indicated for first metatarsal osteotomy with possible Akin osteotomy and bunionette excision.  We did discuss hammertoe correction but given that she has no significant lesser toe deformities we opted to not proceed with hammertoe correction.  She denies any recent fevers or chills or illnesses.  She is ready for surgery.  Past Medical History:  Diagnosis Date  . Anxiety   . Complication of anesthesia    woke up during surgery   . Diabetes mellitus without complication (Sierra Hall)   . History of hiatal hernia     Past Surgical History:  Procedure Laterality Date  . ABDOMINAL HYSTERECTOMY    . BIOPSY  11/14/2018   Procedure: BIOPSY;  Surgeon: Ileana Roup, MD;  Location: [facility];  Service: General;;  . CHOLECYSTECTOMY    . COLONOSCOPY N/A 11/14/2018   Procedure: COLONOSCOPY;  Surgeon: Ileana Roup, MD;  Location: [facility];  Service: General;  Laterality: N/A;  . ESOPHAGOGASTRODUODENOSCOPY (EGD) WITH PROPOFOL N/A 07/05/2018   Procedure: ESOPHAGOGASTRODUODENOSCOPY (EGD) WITH PROPOFOL;  Surgeon: Johnathan Hausen, MD;  Location: [facility];  Service: General;  Laterality: N/A;  . LAPAROSCOPIC GASTRIC BAND REMOVAL WITH LAPAROSCOPIC GASTRIC SLEEVE RESECTION     2013  . LAPAROSCOPIC ROUX-EN-Y GASTRIC BYPASS WITH UPPER ENDOSCOPY AND REMOVAL OF LAP BAND    . POLYPECTOMY  11/14/2018   Procedure: POLYPECTOMY;  Surgeon: Ileana Roup, MD;  Location: [facility];  Service: General;;  . TONSILLECTOMY      Family History  Problem Relation Age of Onset  . Breast cancer Neg Hx    Social  History:  reports that she has quit smoking. She has never used smokeless tobacco. She reports current alcohol use. She reports that she does not use drugs.  Allergies:  Allergies  Allergen Reactions  . Dilaudid [Hydromorphone Hcl] Nausea Only  . Latex Rash  . Penicillin G Rash    Did it involve swelling of the face/tongue/throat, SOB, or low BP? No Did it involve sudden or severe rash/hives, skin peeling, or any reaction on the inside of your mouth or nose? No Did you need to seek medical attention at a hospital or doctor's office? No When did it last happen?13+ years If all above answers are "NO", may proceed with cephalosporin use.    . Tape Rash    Medications Prior to Admission  Medication Sig Dispense Refill  . Biotin 10 MG CAPS Take 10 mg by mouth daily.    . Calcium-Magnesium-Zinc (CAL-MAG-ZINC PO) Take 1 tablet by mouth daily.    . fenofibrate 160 MG tablet Take 160 mg by mouth daily.    . furosemide (LASIX) 20 MG tablet Take 20 mg by mouth daily as needed (for fluid retention/edema).     Marland Kitchen glimepiride (AMARYL) 2 MG tablet Take 1 mg by mouth daily.     Marland Kitchen ibuprofen (ADVIL,MOTRIN) 200 MG tablet Take 600-800 mg by mouth every 6 (six) hours as needed for headache or moderate pain.    Marland Kitchen levothyroxine (SYNTHROID, LEVOTHROID) 137 MCG tablet Take 137 mcg by mouth daily before breakfast.    . Multiple Vitamin (MULTIVITAMIN WITH  MINERALS) TABS tablet Take 1 tablet by mouth daily.    . Omega-3 Fatty Acids (FISH OIL) 1200 MG CAPS Take 1,200 mg by mouth daily.    Marland Kitchen omeprazole (PRILOSEC) 40 MG capsule Take 40 mg by mouth 2 (two) times daily.    . Potassium 99 MG TABS Take 99 mg by mouth daily.    . Probiotic Product (PROBIOTIC PO) Take 1 capsule by mouth daily.    . SUPER B COMPLEX/C PO Take 1 tablet by mouth daily.    . vitamin C (ASCORBIC ACID) 250 MG tablet Take 250 mg by mouth daily.      Results for orders placed or performed during the hospital encounter of 12/26/18 (from  the past 48 hour(s))  Glucose, capillary     Status: Abnormal   Collection Time: 12/26/18  9:54 AM  Result Value Ref Range   Glucose-Capillary 132 (H) 70 - 99 mg/dL   No results found.  Review of Systems  Constitutional: Negative.   HENT: Negative.   Eyes: Negative.   Cardiovascular: Negative.   Gastrointestinal: Negative.   Musculoskeletal:       Right bunion and forefoot pain.  Right bunionette.  Skin: Negative.   Neurological: Negative.   Psychiatric/Behavioral: Negative.     Blood pressure 138/67, pulse 77, temperature 97.8 F (36.6 C), temperature source Oral, resp. rate 18, height 5\' 7"  (1.702 m), weight 76.9 kg, SpO2 99 %. Physical Exam  Constitutional: She appears well-developed.  HENT:  Head: Normocephalic.  Eyes: Conjunctivae are normal.  Neck: Neck supple.  Cardiovascular: Normal rate.  Respiratory: Effort normal.  GI: Soft.  Musculoskeletal:     Comments: Right foot demonstrates bunion deformity and bunionette deformity.  Tenderness palpation plantar forefoot with sessile callus formation.  No lesser MTP instability noted.  Bunion is not passively correctable.  Tender over the medial eminence.  No pain with passive motion of the first MTP joint.  Motor intact.  Sensation intact dorsal plantar surface of foot.  Foot is warm and well-perfused.  Neurological: She is alert.  Skin: Skin is warm.  Psychiatric: She has a normal mood and affect.     Assessment/Plan We will proceed with above plan.  She understands the risk, benefits alternatives which were discussed and include but are not limited to wound healing complications, infection, continued pain, nonunion, malunion, need for further surgery and damage surrounding structures.  We also discussed the perioperative and anesthetic risk which include death.  She understands the weightbearing restrictions postoperatively and agrees to comply.  Erle Crocker, MD 12/26/2018, 11:26 AM

## 2018-12-27 ENCOUNTER — Encounter (HOSPITAL_COMMUNITY): Payer: Self-pay | Admitting: Orthopaedic Surgery

## 2018-12-27 NOTE — Anesthesia Postprocedure Evaluation (Signed)
Anesthesia Post Note  Patient: Sierra Hall  Procedure(s) Performed: RIGHT FOOT BUNION CORRECTION, BUNIONETTE CORRECTION, AKIN OSTEOTOMY (Right Foot)     Patient location during evaluation: PACU Anesthesia Type: Regional and MAC Level of consciousness: awake and alert Pain management: pain level controlled Vital Signs Assessment: post-procedure vital signs reviewed and stable Respiratory status: spontaneous breathing, nonlabored ventilation, respiratory function stable and patient connected to nasal cannula oxygen Cardiovascular status: stable and blood pressure returned to baseline Postop Assessment: no apparent nausea or vomiting Anesthetic complications: no    Last Vitals:  Vitals:   12/26/18 1413 12/26/18 1422  BP: 126/68   Pulse: 66 64  Resp: 11 15  Temp:    SpO2: 97% 96%    Last Pain:  Vitals:   12/26/18 1422  TempSrc:   PainSc: 0-No pain                 Keone Kamer S

## 2019-03-11 ENCOUNTER — Other Ambulatory Visit: Payer: Self-pay | Admitting: Orthopaedic Surgery

## 2019-03-13 ENCOUNTER — Other Ambulatory Visit: Payer: Self-pay | Admitting: Orthopaedic Surgery

## 2019-03-13 ENCOUNTER — Other Ambulatory Visit: Payer: Self-pay

## 2019-03-13 ENCOUNTER — Encounter (HOSPITAL_BASED_OUTPATIENT_CLINIC_OR_DEPARTMENT_OTHER): Payer: Self-pay | Admitting: *Deleted

## 2019-03-14 ENCOUNTER — Encounter (HOSPITAL_BASED_OUTPATIENT_CLINIC_OR_DEPARTMENT_OTHER)
Admission: RE | Admit: 2019-03-14 | Discharge: 2019-03-14 | Disposition: A | Discharge status: Home / Self Care | Payer: BLUE CROSS/BLUE SHIELD | Source: Ambulatory Visit | Attending: Orthopaedic Surgery | Admitting: Orthopaedic Surgery

## 2019-03-14 ENCOUNTER — Other Ambulatory Visit (HOSPITAL_COMMUNITY)
Admission: RE | Admit: 2019-03-14 | Discharge: 2019-03-14 | Disposition: A | Discharge status: Home / Self Care | Payer: BLUE CROSS/BLUE SHIELD | Source: Ambulatory Visit | Attending: Orthopaedic Surgery | Admitting: Orthopaedic Surgery

## 2019-03-14 DIAGNOSIS — Z01812 Encounter for preprocedural laboratory examination: Secondary | ICD-10-CM | POA: Diagnosis present

## 2019-03-14 DIAGNOSIS — Z1159 Encounter for screening for other viral diseases: Secondary | ICD-10-CM | POA: Insufficient documentation

## 2019-03-14 LAB — BASIC METABOLIC PANEL
Anion gap: 14 (ref 5–15)
BUN: 19 mg/dL (ref 6–20)
CO2: 22 mmol/L (ref 22–32)
Calcium: 9.8 mg/dL (ref 8.9–10.3)
Chloride: 103 mmol/L (ref 98–111)
Creatinine, Ser: 0.85 mg/dL (ref 0.44–1.00)
GFR calc Af Amer: >60 mL/min (ref >60)
GFR calc non Af Amer: >60 mL/min (ref >60)
Glucose, Bld: 109 mg/dL — ABNORMAL HIGH (ref 70–99)
Potassium: 4.4 mmol/L (ref 3.5–5.1)
Sodium: 139 mmol/L (ref 135–145)

## 2019-03-14 NOTE — Progress Notes (Signed)
Pt given and instructed to drink Ensure day of surgery by 0515 with teach back method.

## 2019-03-15 LAB — NOVEL CORONAVIRUS, NAA (HOSP ORDER, SEND-OUT TO REF LAB; TAT 18-24 HRS): SARS-CoV-2, NAA: NOT DETECTED

## 2019-03-18 ENCOUNTER — Ambulatory Visit (HOSPITAL_BASED_OUTPATIENT_CLINIC_OR_DEPARTMENT_OTHER): Payer: BLUE CROSS/BLUE SHIELD | Admitting: Anesthesiology

## 2019-03-18 ENCOUNTER — Other Ambulatory Visit: Payer: Self-pay

## 2019-03-18 ENCOUNTER — Ambulatory Visit (HOSPITAL_BASED_OUTPATIENT_CLINIC_OR_DEPARTMENT_OTHER)
Admission: RE | Admit: 2019-03-18 | Discharge: 2019-03-18 | Disposition: A | Discharge status: Home / Self Care | Payer: BLUE CROSS/BLUE SHIELD | Attending: Orthopaedic Surgery | Admitting: Orthopaedic Surgery

## 2019-03-18 ENCOUNTER — Encounter (HOSPITAL_BASED_OUTPATIENT_CLINIC_OR_DEPARTMENT_OTHER): Payer: Self-pay

## 2019-03-18 ENCOUNTER — Encounter (HOSPITAL_BASED_OUTPATIENT_CLINIC_OR_DEPARTMENT_OTHER)
Admission: RE | Disposition: A | Discharge status: Home / Self Care | Payer: Self-pay | Source: Home / Self Care | Attending: Orthopaedic Surgery

## 2019-03-18 DIAGNOSIS — Z7984 Long term (current) use of oral hypoglycemic drugs: Secondary | ICD-10-CM | POA: Insufficient documentation

## 2019-03-18 DIAGNOSIS — E119 Type 2 diabetes mellitus without complications: Secondary | ICD-10-CM | POA: Insufficient documentation

## 2019-03-18 DIAGNOSIS — Z87891 Personal history of nicotine dependence: Secondary | ICD-10-CM | POA: Insufficient documentation

## 2019-03-18 DIAGNOSIS — T8484XA Pain due to internal orthopedic prosthetic devices, implants and grafts, initial encounter: Secondary | ICD-10-CM | POA: Insufficient documentation

## 2019-03-18 DIAGNOSIS — Y838 Other surgical procedures as the cause of abnormal reaction of the patient, or of later complication, without mention of misadventure at the time of the procedure: Secondary | ICD-10-CM | POA: Insufficient documentation

## 2019-03-18 DIAGNOSIS — F419 Anxiety disorder, unspecified: Secondary | ICD-10-CM | POA: Diagnosis not present

## 2019-03-18 DIAGNOSIS — K219 Gastro-esophageal reflux disease without esophagitis: Secondary | ICD-10-CM | POA: Diagnosis not present

## 2019-03-18 DIAGNOSIS — Z79890 Hormone replacement therapy (postmenopausal): Secondary | ICD-10-CM | POA: Diagnosis not present

## 2019-03-18 DIAGNOSIS — Z79899 Other long term (current) drug therapy: Secondary | ICD-10-CM | POA: Diagnosis not present

## 2019-03-18 HISTORY — DX: Other specified postprocedural states: Z98.890

## 2019-03-18 HISTORY — DX: Other specified postprocedural states: R11.2

## 2019-03-18 HISTORY — DX: Gastro-esophageal reflux disease without esophagitis: K21.9

## 2019-03-18 HISTORY — PX: HARDWARE REMOVAL: SHX979

## 2019-03-18 LAB — GLUCOSE, CAPILLARY
Glucose-Capillary: 136 mg/dL — ABNORMAL HIGH (ref 70–99)
Glucose-Capillary: 116 mg/dL — ABNORMAL HIGH (ref 70–99)

## 2019-03-18 SURGERY — REMOVAL, HARDWARE
Anesthesia: General | Laterality: Right

## 2019-03-18 MED ORDER — 0.9 % SODIUM CHLORIDE (POUR BTL) OPTIME
TOPICAL | Status: DC | PRN
  Administered 2019-03-18: 1000 mL

## 2019-03-18 MED ORDER — MIDAZOLAM HCL 2 MG/2ML IJ SOLN
1.0000 mg | INTRAMUSCULAR | Status: DC | PRN
  Administered 2019-03-18: 2 mg via INTRAVENOUS

## 2019-03-18 MED ORDER — OXYCODONE HCL 5 MG PO TABS: 5.0000 mg | ORAL_TABLET | Freq: Once | ORAL | Status: DC | PRN

## 2019-03-18 MED ORDER — CEFAZOLIN SODIUM-DEXTROSE 2-4 GM/100ML-% IV SOLN
INTRAVENOUS | Status: AC
  Filled 2019-03-18: qty 100

## 2019-03-18 MED ORDER — PROPOFOL 500 MG/50ML IV EMUL
INTRAVENOUS | Status: DC | PRN
  Administered 2019-03-18: 25 ug/kg/min via INTRAVENOUS

## 2019-03-18 MED ORDER — PROMETHAZINE HCL 25 MG/ML IJ SOLN: 6.2500 mg | INTRAMUSCULAR | Status: DC | PRN

## 2019-03-18 MED ORDER — FENTANYL CITRATE (PF) 100 MCG/2ML IJ SOLN
50.0000 ug | INTRAMUSCULAR | Status: DC | PRN
  Administered 2019-03-18 (×2): 50 ug via INTRAVENOUS

## 2019-03-18 MED ORDER — CHLORHEXIDINE GLUCONATE 4 % EX LIQD: 60.0000 mL | Freq: Once | CUTANEOUS | Status: DC

## 2019-03-18 MED ORDER — LIDOCAINE HCL (CARDIAC) PF 100 MG/5ML IV SOSY
PREFILLED_SYRINGE | INTRAVENOUS | Status: DC | PRN
  Administered 2019-03-18: 60 mg via INTRAVENOUS

## 2019-03-18 MED ORDER — PROPOFOL 10 MG/ML IV BOLUS
INTRAVENOUS | Status: DC | PRN
  Administered 2019-03-18: 150 mg via INTRAVENOUS

## 2019-03-18 MED ORDER — BUPIVACAINE HCL 0.5 % IJ SOLN
INTRAMUSCULAR | Status: DC | PRN
  Administered 2019-03-18: 10 mL

## 2019-03-18 MED ORDER — FENTANYL CITRATE (PF) 100 MCG/2ML IJ SOLN: 25.0000 ug | INTRAMUSCULAR | Status: DC | PRN

## 2019-03-18 MED ORDER — SCOPOLAMINE 1 MG/3DAYS TD PT72: 1.0000 | MEDICATED_PATCH | Freq: Once | TRANSDERMAL | Status: DC | PRN

## 2019-03-18 MED ORDER — OXYCODONE HCL 5 MG/5ML PO SOLN: 5.0000 mg | Freq: Once | ORAL | Status: DC | PRN

## 2019-03-18 MED ORDER — DEXAMETHASONE SODIUM PHOSPHATE 10 MG/ML IJ SOLN
INTRAMUSCULAR | Status: DC | PRN
  Administered 2019-03-18: 10 mg via INTRAVENOUS

## 2019-03-18 MED ORDER — FENTANYL CITRATE (PF) 100 MCG/2ML IJ SOLN
INTRAMUSCULAR | Status: AC
  Filled 2019-03-18: qty 2

## 2019-03-18 MED ORDER — MIDAZOLAM HCL 2 MG/2ML IJ SOLN
INTRAMUSCULAR | Status: AC
  Filled 2019-03-18: qty 2

## 2019-03-18 MED ORDER — LACTATED RINGERS IV SOLN
INTRAVENOUS | Status: DC
  Administered 2019-03-18 (×2): via INTRAVENOUS

## 2019-03-18 SURGICAL SUPPLY — 55 items
BANDAGE ACE 4X5 VEL STRL LF (GAUZE/BANDAGES/DRESSINGS) ×3 IMPLANT
BANDAGE ACE 6X5 VEL STRL LF (GAUZE/BANDAGES/DRESSINGS) IMPLANT
BANDAGE ESMARK 6X9 LF (GAUZE/BANDAGES/DRESSINGS) ×1 IMPLANT
BENZOIN TINCTURE PRP APPL 2/3 (GAUZE/BANDAGES/DRESSINGS) IMPLANT
BLADE SURG 15 STRL LF DISP TIS (BLADE) ×2 IMPLANT
BLADE SURG 15 STRL SS (BLADE) ×4
BNDG ESMARK 4X9 LF (GAUZE/BANDAGES/DRESSINGS) ×3 IMPLANT
BNDG ESMARK 6X9 LF (GAUZE/BANDAGES/DRESSINGS) ×3
CHLORAPREP W/TINT 26 (MISCELLANEOUS) ×3 IMPLANT
CLOSURE WOUND 1/2 X4 (GAUZE/BANDAGES/DRESSINGS)
COVER BACK TABLE REUSABLE LG (DRAPES) ×3 IMPLANT
COVER WAND RF STERILE (DRAPES) IMPLANT
CUFF TOURN SGL QUICK 34 (TOURNIQUET CUFF)
CUFF TRNQT CYL 34X4.125X (TOURNIQUET CUFF) IMPLANT
DECANTER SPIKE VIAL GLASS SM (MISCELLANEOUS) IMPLANT
DRAPE EXTREMITY T 121X128X90 (DISPOSABLE) ×3 IMPLANT
DRAPE IMP U-DRAPE 54X76 (DRAPES) ×3 IMPLANT
DRAPE U-SHAPE 47X51 STRL (DRAPES) ×3 IMPLANT
ELECT REM PT RETURN 9FT ADLT (ELECTROSURGICAL) ×3
ELECTRODE REM PT RTRN 9FT ADLT (ELECTROSURGICAL) ×1 IMPLANT
GAUZE SPONGE 4X4 12PLY STRL (GAUZE/BANDAGES/DRESSINGS) ×3 IMPLANT
GAUZE XEROFORM 1X8 LF (GAUZE/BANDAGES/DRESSINGS) ×3 IMPLANT
GLOVE BIO SURGEON STRL SZ7.5 (GLOVE) ×3 IMPLANT
GLOVE BIOGEL PI IND STRL 8 (GLOVE) ×1 IMPLANT
GLOVE BIOGEL PI INDICATOR 8 (GLOVE) ×2
GOWN STRL REUS W/ TWL LRG LVL3 (GOWN DISPOSABLE) ×1 IMPLANT
GOWN STRL REUS W/ TWL XL LVL3 (GOWN DISPOSABLE) ×1 IMPLANT
GOWN STRL REUS W/TWL LRG LVL3 (GOWN DISPOSABLE) ×2
GOWN STRL REUS W/TWL XL LVL3 (GOWN DISPOSABLE) ×2
NEEDLE HYPO 25X1 1.5 SAFETY (NEEDLE) IMPLANT
NS IRRIG 1000ML POUR BTL (IV SOLUTION) ×3 IMPLANT
PACK BASIN DAY SURGERY FS (CUSTOM PROCEDURE TRAY) ×3 IMPLANT
PAD CAST 4YDX4 CTTN HI CHSV (CAST SUPPLIES) ×1 IMPLANT
PADDING CAST COTTON 4X4 STRL (CAST SUPPLIES) ×2
PADDING CAST SYNTHETIC 4 (CAST SUPPLIES)
PADDING CAST SYNTHETIC 4X4 STR (CAST SUPPLIES) IMPLANT
PENCIL BUTTON HOLSTER BLD 10FT (ELECTRODE) ×3 IMPLANT
SLEEVE SCD COMPRESS KNEE MED (MISCELLANEOUS) ×3 IMPLANT
SPLINT FIBERGLASS 4X30 (CAST SUPPLIES) IMPLANT
SPONGE LAP 18X18 RF (DISPOSABLE) IMPLANT
STOCKINETTE 6  STRL (DRAPES) ×2
STOCKINETTE 6 STRL (DRAPES) ×1 IMPLANT
STRIP CLOSURE SKIN 1/2X4 (GAUZE/BANDAGES/DRESSINGS) IMPLANT
SUCTION FRAZIER HANDLE 10FR (MISCELLANEOUS) ×2
SUCTION TUBE FRAZIER 10FR DISP (MISCELLANEOUS) ×1 IMPLANT
SUT ETHILON 3 0 PS 1 (SUTURE) ×3 IMPLANT
SUT MNCRL AB 3-0 PS2 18 (SUTURE) ×3 IMPLANT
SUT PDS AB 2-0 CT2 27 (SUTURE) IMPLANT
SUT VIC AB 3-0 FS2 27 (SUTURE) IMPLANT
SYR BULB 3OZ (MISCELLANEOUS) ×3 IMPLANT
SYR CONTROL 10ML LL (SYRINGE) IMPLANT
TOWEL GREEN STERILE FF (TOWEL DISPOSABLE) ×6 IMPLANT
TUBE CONNECTING 20'X1/4 (TUBING) ×1
TUBE CONNECTING 20X1/4 (TUBING) ×2 IMPLANT
UNDERPAD 30X30 (UNDERPADS AND DIAPERS) ×3 IMPLANT

## 2019-03-18 NOTE — Anesthesia Preprocedure Evaluation (Addendum)
Anesthesia Evaluation  Patient identified by MRN, date of birth, ID band Patient awake    Reviewed: Allergy & Precautions, NPO status , Patient's Chart, lab work & pertinent test results  History of Anesthesia Complications (+) PONV, AWARENESS UNDER ANESTHESIA and history of anesthetic complications  Airway Mallampati: II  TM Distance: >3 FB Neck ROM: Full    Dental  (+) Dental Advisory Given, Teeth Intact   Pulmonary former smoker,    breath sounds clear to auscultation       Cardiovascular (-) anginanegative cardio ROS   Rhythm:Regular Rate:Normal     Neuro/Psych PSYCHIATRIC DISORDERS Anxiety negative neurological ROS     GI/Hepatic Neg liver ROS, hiatal hernia, GERD  Medicated and Controlled, S/p gastric sleeve and roux-en-y bypass     Endo/Other  diabetes, Type 2, Oral Hypoglycemic AgentsHypothyroidism   Renal/GU negative Renal ROS     Musculoskeletal negative musculoskeletal ROS (+)   Abdominal   Peds  Hematology negative hematology ROS (+)   Anesthesia Other Findings   Reproductive/Obstetrics                            Anesthesia Physical Anesthesia Plan  ASA: II  Anesthesia Plan: General   Post-op Pain Management:    Induction: Intravenous  PONV Risk Score and Plan: 4 or greater and Treatment may vary due to age or medical condition, Ondansetron, Midazolam, Propofol infusion and Dexamethasone  Airway Management Planned: LMA  Additional Equipment: None  Intra-op Plan:   Post-operative Plan: Extubation in OR  Informed Consent: I have reviewed the patients History and Physical, chart, labs and discussed the procedure including the risks, benefits and alternatives for the proposed anesthesia with the patient or authorized representative who has indicated his/her understanding and acceptance.     Dental advisory given  Plan Discussed with: CRNA and  Anesthesiologist  Anesthesia Plan Comments:        Anesthesia Quick Evaluation

## 2019-03-18 NOTE — Discharge Instructions (Signed)
DR. Lucia Gaskins FOOT [facility] ANKLE SURGERY POST-OP INSTRUCTIONS   Pain Management 1. The numbing medicine and your leg will last around 4 hours, take a dose of your pain medicine as soon as you feel it wearing off to avoid rebound pain. 2. Keep your foot elevated above heart level.  Make sure that your heel hangs free ('floats'). 3. Take all prescribed medication as directed. 4. If taking narcotic pain medication you may want to use an over-the-counter stool softener to avoid constipation. 5. You may take over-the-counter NSAIDs (ibuprofen, naproxen, etc.) as well as over-the-counter acetaminophen as directed on the packaging as a supplement for your pain and may also use it to wean away from the prescription medication.  Activity ? Weightbearing as tolerated in a boot ? Non-weightbearing ? Postoperatively, you will be placed into a splint which stays on for 2 weeks and then will be changed at your first postop visit.  First Postoperative Visit 1. Your first postop visit will be at least 2 weeks after surgery.  This should be scheduled when you schedule surgery. 2. If you do not have a postoperative visit scheduled please call [phone] to schedule an appointment. 3. At the appointment your incision will be evaluated for suture removal, x-rays will be obtained if necessary.  General Instructions 1. Swelling is very common after foot and ankle surgery.  It often takes 3 months for the foot and ankle to begin to feel comfortable.  Some amount of swelling will persist for 6-12 months. 2. DO NOT change the dressing.  If there is a problem with the dressing (too tight, loose, gets wet, etc.) please contact Dr. Pollie Friar office. 3. DO NOT get the dressing wet.  For showers you can use an over-the-counter cast cover or wrap a washcloth around the top of your dressing and then cover it with a plastic bag and tape it to your leg. 4. DO NOT soak the incision (no tubs, pools, bath, etc.) until you have approval  from Dr. Lucia Gaskins.  Contact Dr. Huel Cote office or go to Emergency Room if: 1. Temperature above 101 F. 2. Increasing pain that is unresponsive to pain medication or elevation 3. Excessive redness or swelling in your foot 4. Dressing problems - excessive bloody drainage, looseness or tightness, or if dressing gets wet 5. Develop pain, swelling, warmth, or discoloration of your calf      Post Anesthesia Home Care Instructions  Activity: Get plenty of rest for the remainder of the day. A responsible individual must stay with you for 24 hours following the procedure.  For the next 24 hours, DO NOT: -Drive a car -Paediatric nurse -Drink alcoholic beverages -Take any medication unless instructed by your physician -Make any legal decisions or sign important papers.  Meals: Start with liquid foods such as gelatin or soup. Progress to regular foods as tolerated. Avoid greasy, spicy, heavy foods. If nausea and/or vomiting occur, drink only clear liquids until the nausea and/or vomiting subsides. Call your physician if vomiting continues.  Special Instructions/Symptoms: Your throat may feel dry or sore from the anesthesia or the breathing tube placed in your throat during surgery. If this causes discomfort, gargle with warm salt water. The discomfort should disappear within 24 hours.  If you had a scopolamine patch placed behind your ear for the management of post- operative nausea and/or vomiting:  1. The medication in the patch is effective for 72 hours, after which it should be removed.  Wrap patch in a tissue and discard in  the trash. Wash hands thoroughly with soap and water. 2. You may remove the patch earlier than 72 hours if you experience unpleasant side effects which may include dry mouth, dizziness or visual disturbances. 3. Avoid touching the patch. Wash your hands with soap and water after contact with the patch.

## 2019-03-18 NOTE — Anesthesia Postprocedure Evaluation (Signed)
Anesthesia Post Note  Patient: Sierra Hall  Procedure(s) Performed: HARDWARE REMOVAL DEEP RIGHT FOOT (Right )     Patient location during evaluation: PACU Anesthesia Type: General Level of consciousness: awake and alert Pain management: pain level controlled Vital Signs Assessment: post-procedure vital signs reviewed and stable Respiratory status: spontaneous breathing, nonlabored ventilation and respiratory function stable Cardiovascular status: blood pressure returned to baseline and stable Postop Assessment: no apparent nausea or vomiting Anesthetic complications: no    Last Vitals:  Vitals:   03/18/19 1015 03/18/19 1030  BP: 127/72 131/75  Pulse: 66 69  Resp: 15 11  Temp:    SpO2: 100% 100%    Last Pain:  Vitals:   03/18/19 1002  TempSrc:   PainSc: 0-No pain                 Audry Pili

## 2019-03-18 NOTE — Transfer of Care (Signed)
Immediate Anesthesia Transfer of Care Note  Patient: [name]  Procedure(s) Performed: HARDWARE REMOVAL DEEP RIGHT FOOT (Right )  Patient Location: PACU  Anesthesia Type:General  Level of Consciousness: drowsy and patient cooperative  Airway & Oxygen Therapy: Patient Spontanous Breathing and Patient connected to nasal cannula oxygen  Post-op Assessment: Report given to RN and Post -op Vital signs reviewed and stable  Post vital signs: Reviewed and stable  Last Vitals:  Vitals Value Taken Time  BP    Temp    Pulse 63 03/18/2019 10:02 AM  Resp 10 03/18/2019 10:02 AM  SpO2 100 % 03/18/2019 10:02 AM  Vitals shown include unvalidated device data.  Last Pain:  Vitals:   03/18/19 0739  TempSrc: Oral  PainSc: 4          Complications: No apparent anesthesia complications

## 2019-03-18 NOTE — H&P (Signed)
ANGENI CHAUDHURI is an 58 y.o. female.   Chief Complaint: Right foot symptomatic orthopedic hardware HPI: Tanya is here today for hardware removal about her fifth metatarsal osteotomy site.  She had surgery on 12/26/2018 and did well however the screw at the fifth metatarsal neck osteotomy site has backed out and is now prominent and causing pain and swelling.  Since we saw her in the office it has continued to be painful.  She denies any new symptoms.  She denies any pain about her hallux or medial foot.  She denies any recent fevers or chills or illnesses.  She has been ambulating in regular shoes.  Past Medical History:  Diagnosis Date  . Anxiety   . Complication of anesthesia    woke up during surgery   . Diabetes mellitus without complication ([place])   . GERD (gastroesophageal reflux disease)   . History of hiatal hernia   . PONV (postoperative nausea and vomiting)     Past Surgical History:  Procedure Laterality Date  . ABDOMINAL HYSTERECTOMY    . BIOPSY  11/14/2018   Procedure: BIOPSY;  Surgeon: Ileana Roup, MD;  Location: [facility];  Service: General;;  . Lillard Anes WITH WEIL OSTEOTOMY Right 12/26/2018   Procedure: RIGHT FOOT BUNION CORRECTION, BUNIONETTE CORRECTION, AKIN OSTEOTOMY;  Surgeon: Erle Crocker, MD;  Location: [facility];  Service: Orthopedics;  Laterality: Right;  MAC WITH ANKLE BLOCK  . CHOLECYSTECTOMY    . COLONOSCOPY N/A 11/14/2018   Procedure: COLONOSCOPY;  Surgeon: Ileana Roup, MD;  Location: [facility];  Service: General;  Laterality: N/A;  . ESOPHAGOGASTRODUODENOSCOPY (EGD) WITH PROPOFOL N/A 07/05/2018   Procedure: ESOPHAGOGASTRODUODENOSCOPY (EGD) WITH PROPOFOL;  Surgeon: Johnathan Hausen, MD;  Location: [facility];  Service: General;  Laterality: N/A;  . LAPAROSCOPIC GASTRIC BAND REMOVAL WITH LAPAROSCOPIC GASTRIC SLEEVE RESECTION     2013  . LAPAROSCOPIC ROUX-EN-Y GASTRIC BYPASS WITH UPPER ENDOSCOPY AND REMOVAL OF LAP BAND    . POLYPECTOMY   11/14/2018   Procedure: POLYPECTOMY;  Surgeon: Ileana Roup, MD;  Location: [facility];  Service: General;;  . TONSILLECTOMY      Family History  Problem Relation Age of Onset  . Breast cancer Neg Hx    Social History:  reports that she has quit smoking. She has never used smokeless tobacco. She reports current alcohol use. She reports that she does not use drugs.  Allergies:  Allergies  Allergen Reactions  . Dilaudid [Hydromorphone Hcl] Nausea Only  . Latex Rash  . Penicillin G Rash    Did it involve swelling of the face/tongue/throat, SOB, or low BP? No Did it involve sudden or severe rash/hives, skin peeling, or any reaction on the inside of your mouth or nose? No Did you need to seek medical attention at a hospital or doctor's office? No When did it last happen?13+ years If all above answers are "NO", may proceed with cephalosporin use.    . Tape Rash    Medications Prior to Admission  Medication Sig Dispense Refill  . Biotin 10 MG CAPS Take 10 mg by mouth daily.    . Calcium-Magnesium-Zinc (CAL-MAG-ZINC PO) Take 1 tablet by mouth daily.    . fenofibrate 160 MG tablet Take 160 mg by mouth daily.    . furosemide (LASIX) 20 MG tablet Take 20 mg by mouth daily as needed (for fluid retention/edema).     Marland Kitchen glimepiride (AMARYL) 2 MG tablet Take 1 mg by mouth daily.     Marland Kitchen ibuprofen (  ADVIL,MOTRIN) 200 MG tablet Take 600-800 mg by mouth every 6 (six) hours as needed for headache or moderate pain.    Marland Kitchen levothyroxine (SYNTHROID, LEVOTHROID) 137 MCG tablet Take 137 mcg by mouth daily before breakfast.    . Multiple Vitamin (MULTIVITAMIN WITH MINERALS) TABS tablet Take 1 tablet by mouth daily.    . Omega-3 Fatty Acids (FISH OIL) 1200 MG CAPS Take 1,200 mg by mouth daily.    Marland Kitchen omeprazole (PRILOSEC) 40 MG capsule Take 40 mg by mouth 2 (two) times daily.    . Potassium 99 MG TABS Take 99 mg by mouth daily.    . Probiotic Product (PROBIOTIC PO) Take 1 capsule by mouth daily.     . SUPER B COMPLEX/C PO Take 1 tablet by mouth daily.    . vitamin C (ASCORBIC ACID) 250 MG tablet Take 250 mg by mouth daily.    . ondansetron (ZOFRAN) 4 MG tablet Take 1 tablet (4 mg total) by mouth every 8 (eight) hours as needed for nausea or vomiting. 30 tablet 1    Results for orders placed or performed during the hospital encounter of 03/18/19 (from the past 48 hour(s))  Glucose, capillary     Status: Abnormal   Collection Time: 03/18/19  8:02 AM  Result Value Ref Range   Glucose-Capillary 136 (H) 70 - 99 mg/dL   No results found.  Review of Systems  Constitutional: Negative.   HENT: Negative.   Eyes: Negative.   Cardiovascular: Negative.   Gastrointestinal: Negative.   Musculoskeletal:       Right foot lateral forefoot pain and swelling.  Skin: Negative.   Neurological: Negative.   Psychiatric/Behavioral: Negative.     Blood pressure (!) 112/51, pulse 69, temperature 98.3 F (36.8 C), temperature source Oral, resp. rate 18, height _0  (1.702 m), weight 81 kg, SpO2 99 %. Physical Exam  Constitutional: She appears well-developed.  HENT:  Head: Normocephalic.  Eyes: Conjunctivae are normal.  Neck: Neck supple.  Cardiovascular: Normal rate.  Respiratory: Effort normal.  GI: Soft.  Musculoskeletal:     Comments: Right foot demonstrates well-healed surgical incisions from previous surgery about the medial and lateral foot.  Distal lateral forefoot shows some swelling and prominence.  There is tenderness palpation at the neck of the fifth metatarsal.  There is palpable screw head.  Toe alignment is well-maintained.  No other areas of tenderness to palpation today.  Sensation intact in superficial peroneal, deep peroneal nerve distributions.  Sensation intact about the plantar foot.  Palpable dorsalis pedis pulse.  Patient has active ankle dorsiflexion plantarflexion.  Neurological: She is alert.  Skin: Skin is warm.  Psychiatric: She has a normal mood and affect.      Assessment/Plan We will proceed with hardware removal.  The screw head is quite prominent and is causing some overlying skin irritation which is concerning for skin breakdown.  We will check stability of the osteotomy site at that time to determine whether or not new fixation needs to be added.  We discussed the risk, benefits and alternatives of surgery extensively in previous visit.  Risks include but are not limited to wound healing complications, infection, continued pain, need for further surgery or damage surrounding structures.  She understands these risks and wishes to proceed.  Erle Crocker, MD 03/18/2019, 8:49 AM

## 2019-03-18 NOTE — Op Note (Signed)
Sierra Hall female 58 y.o. 03/18/2019  PreOperative Diagnosis: Symptomatic orthopedic hardware right foot  PostOperative Diagnosis: Same  PROCEDURE: Deep hardware removal, right foot  SURGEON: Melony Overly, MD  ASSISTANT: None  ANESTHESIA: General LMA with local anesthesia  FINDINGS: Loosened fifth metatarsal neck screw with overlying bursal fluid and tissue Stable osteotomy site  IMPLANTS: None  INDICATIONS:57 y.o. female had bunion and bunionette surgery on 12/26/2018 and did well.  Over the past couple of weeks she is noted some prominence on the dorsal aspect of her distal fifth metatarsal.  She was seen in the office and it was noted that the osteotomy screw had backed out and was causing some soft tissue irritation overlying this area.  She had most of her pain was dorsal but did have some discomfort on the plantar aspect of her distal fifth metatarsal.  Given the amount of skin irritation from the screw she was indicated for hardware removal.  We discussed the risk, benefits and alternatives of surgery which include but were not limited to wound healing complications, infection, continued pain, need for further surgery demonstrating structures and she wished to proceed.  I did have a discussion that there could be some instability at the osteotomy site as the reason for the screw backing out and plan to stress the osteotomy under fluoroscopy after screw removal.  She understood this and wished to proceed.  PROCEDURE: Patient was identified the preoperative holding area.  The right foot was marked by myself.  The consent was signed by myself and the patient.  She was taken the operative suite placed supine the operative table.  Bump was placed under the right hip after general LMA anesthesia was induced without difficulty.  Preoperative antibiotics were given.  The right lower extremity was prepped and draped in usual sterile fashion.  Surgical timeout was performed.   Bone foam was used.  A 4 inch Esmarch ankle tourniquet was placed.  We began by making a longitudinal incision overlying the previous incision for her fifth metatarsal osteotomy site.  This taken sharply down through skin and subcutaneous tissue.  Then using blunt dissection the deep tissue was open.  There is a rush of clear serous type fluid and the appearance of a bursal sac overlying the screw which was loose within the bone and floating in the soft tissues above the metatarsal neck.  The screw was identified and removed using a needle driver.  Then the metatarsal neck was inspected and there was a cavity where the screw had been and had loosened.  This was curetted out using a small house curette.  Then the bursal tissue overlying the fifth metatarsal neck was removed and excised sharply with 15 blade.  Then fluoroscopy was used to confirm screw removal.  The osteotomy site was stressed under fluoroscopy and found to be stable.  The wound was then irrigated and the tourniquet released.  There was bleeding within the bony cavity.  Then 3-0 Monocryl was used for subcuticular stitch and 3-0 nylon for the skin.  She was then placed in a soft dressing including Xeroform, 4 x 4's and sterile she cotton.  A 4 inch Ace wrap was placed.  All counts were correct at the end of the case.  There are no complications.  She was awake from anesthesia and taken to recovery in stable condition.  POST OPERATIVE INSTRUCTIONS: Weightbearing as tolerated in a postoperative shoe Keep dressing in place until follow-up Call the office with concerns No need for  DVT prophylaxis in this ambulatory patient   TOURNIQUET TIME: 7 minutes  BLOOD LOSS:  Minimal         DRAINS: none         SPECIMEN: none       COMPLICATIONS:  * No complications entered in OR log *         Disposition: PACU - hemodynamically stable.         Condition: stable

## 2019-03-18 NOTE — Anesthesia Procedure Notes (Signed)
Procedure Name: LMA Insertion Date/Time: 03/18/2019 9:26 AM Performed by: Signe Colt, CRNA Pre-anesthesia Checklist: Patient identified, Emergency Drugs available, Suction available and Patient being monitored Patient Re-evaluated:Patient Re-evaluated prior to induction Oxygen Delivery Method: Circle system utilized Preoxygenation: Pre-oxygenation with 100% oxygen Induction Type: IV induction Ventilation: Mask ventilation without difficulty LMA: LMA inserted LMA Size: 4.0 Number of attempts: 1 Airway Equipment and Method: Bite block Placement Confirmation: positive ETCO2 Tube secured with: Tape Dental Injury: Teeth and Oropharynx as per pre-operative assessment

## 2019-03-19 ENCOUNTER — Encounter (HOSPITAL_BASED_OUTPATIENT_CLINIC_OR_DEPARTMENT_OTHER): Payer: Self-pay | Admitting: Orthopaedic Surgery

## 2019-05-15 ENCOUNTER — Other Ambulatory Visit: Payer: Self-pay | Admitting: Family Medicine

## 2019-05-15 DIAGNOSIS — Z1231 Encounter for screening mammogram for malignant neoplasm of breast: Secondary | ICD-10-CM

## 2019-06-18 ENCOUNTER — Other Ambulatory Visit: Payer: Self-pay

## 2019-06-18 ENCOUNTER — Ambulatory Visit
Admission: RE | Admit: 2019-06-18 | Discharge: 2019-06-18 | Disposition: A | Discharge status: Home / Self Care | Payer: BC Managed Care – PPO | Source: Ambulatory Visit | Attending: Family Medicine | Admitting: Family Medicine

## 2019-06-18 DIAGNOSIS — Z1231 Encounter for screening mammogram for malignant neoplasm of breast: Secondary | ICD-10-CM | POA: Diagnosis present

## 2020-10-11 IMAGING — MG DIGITAL SCREENING BILATERAL MAMMOGRAM WITH TOMO AND CAD
8 series · 8 of 24 positions shown · non-contrast
Comparison: Previous exam(s).

CLINICAL DATA: Screening.

EXAM:
DIGITAL SCREENING BILATERAL MAMMOGRAM WITH TOMO AND CAD

[R CC synth-2D]
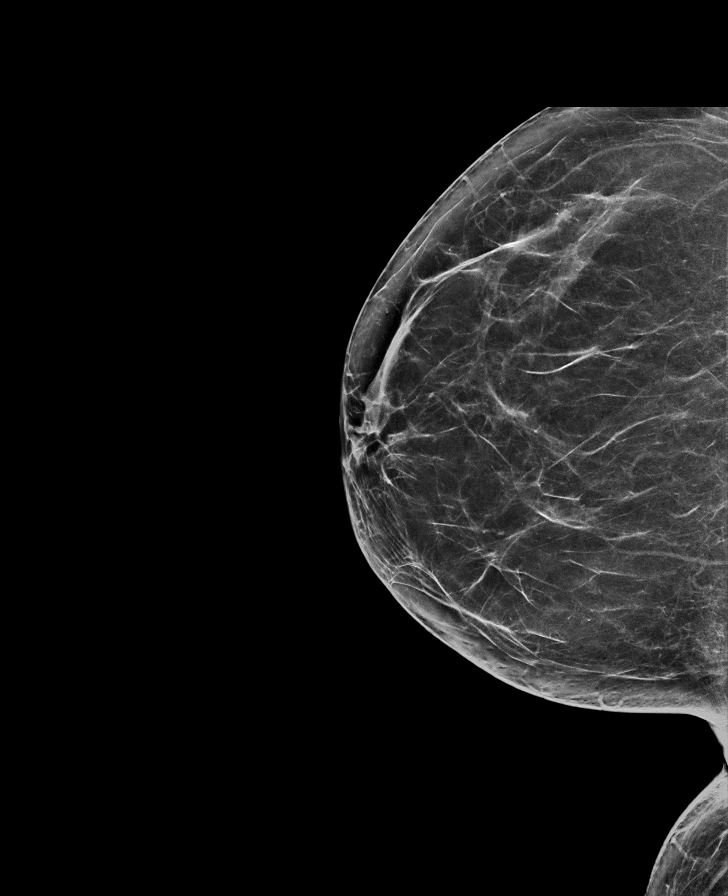

[L MLO synth-2D]
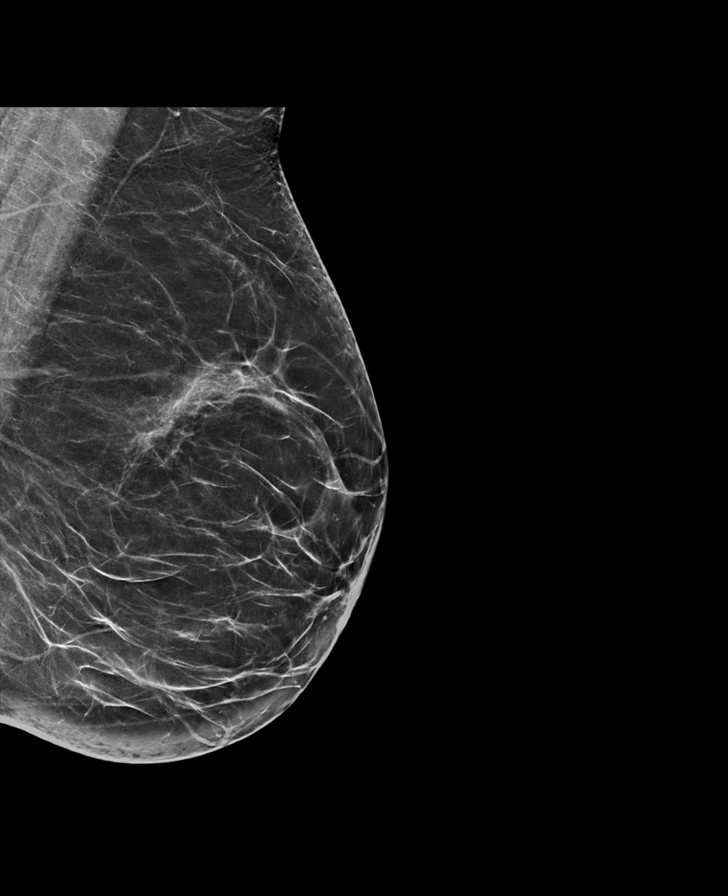

[R MLO synth-2D]
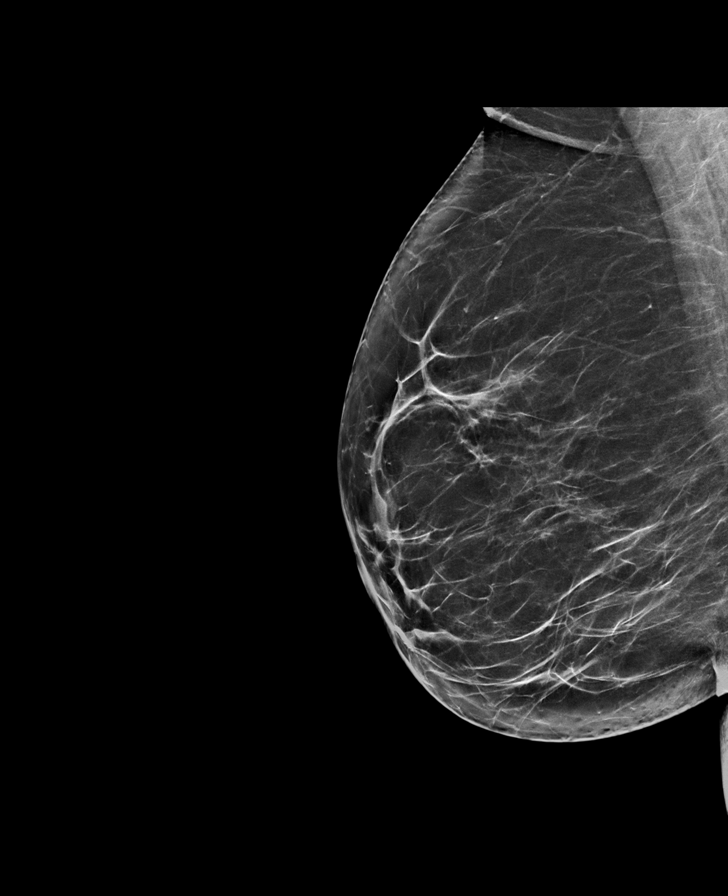

[L CC synth-2D]
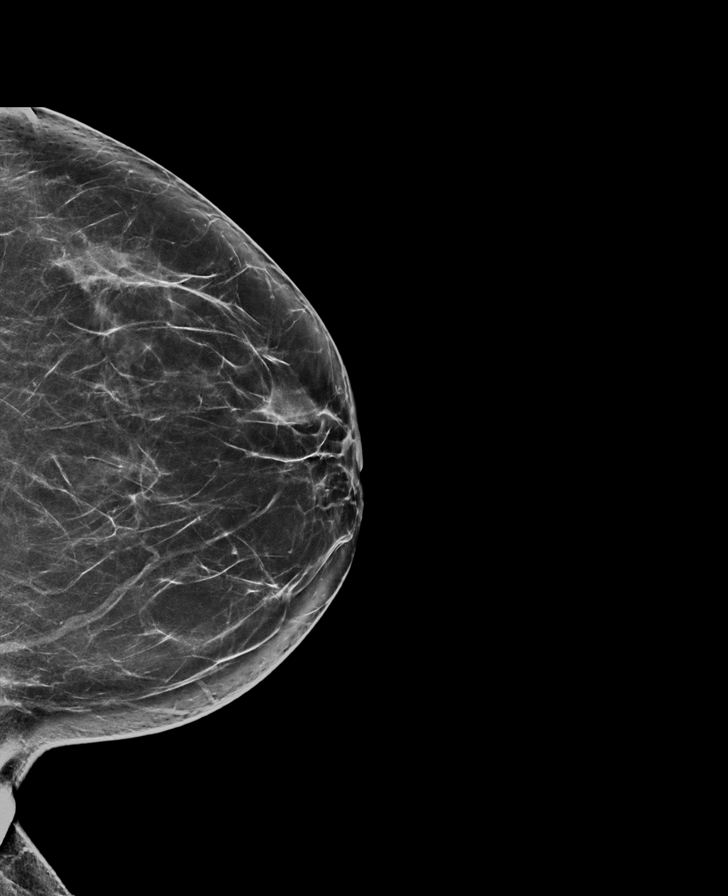

[L CC tomo · tomo slice 40/79.0]
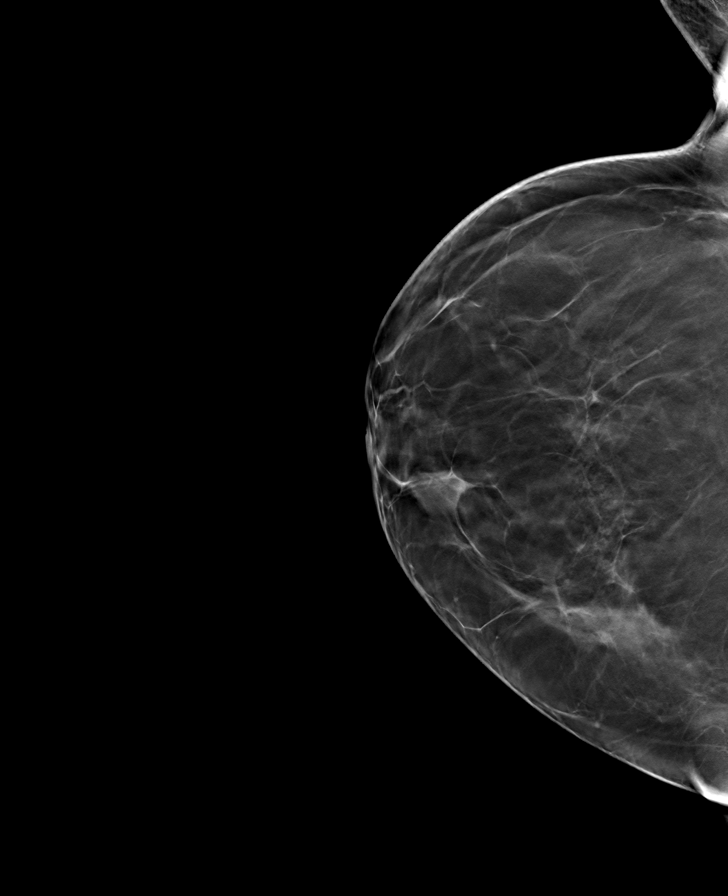

[L MLO tomo · tomo slice 40/79.0]
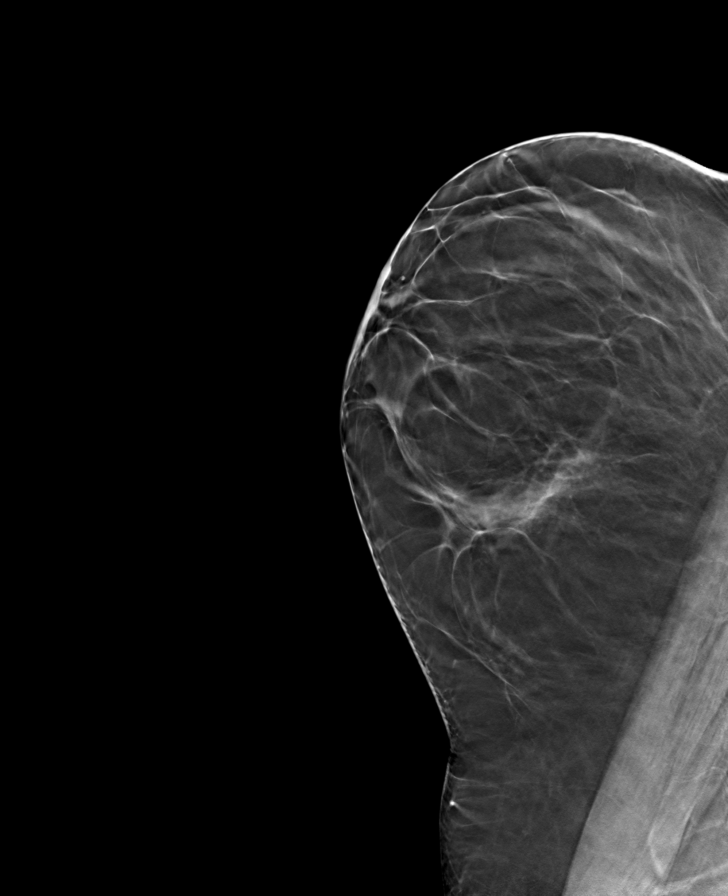

[R CC tomo · tomo slice 38/75.0]
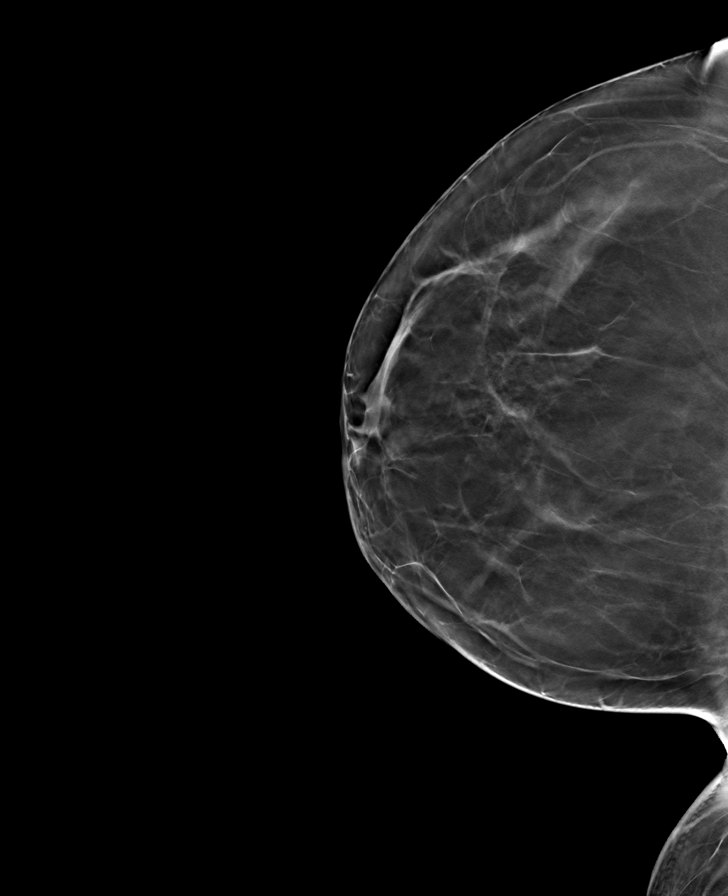

[R MLO tomo · tomo slice 39/77.0]
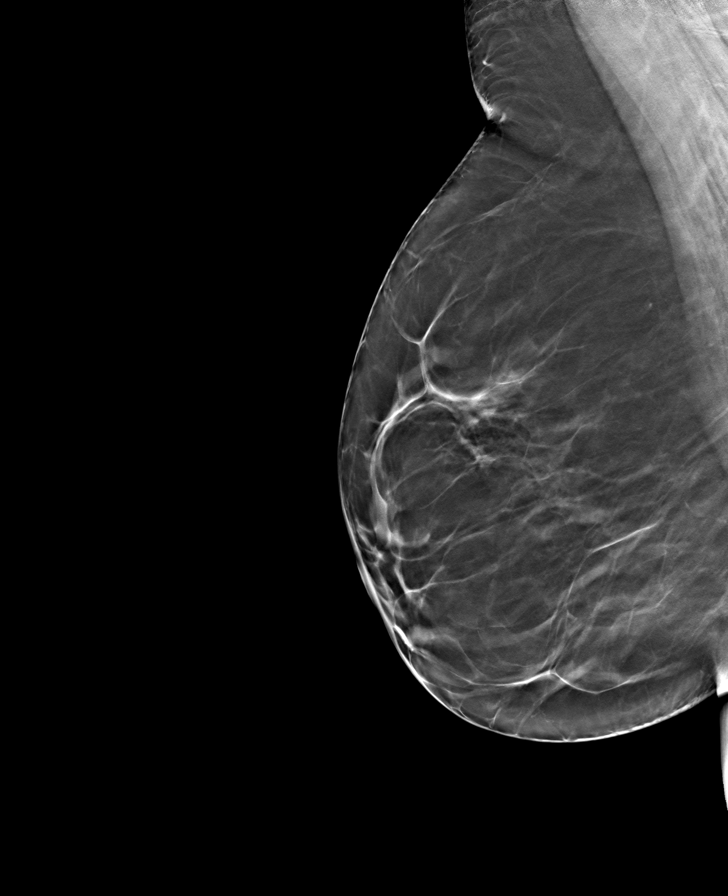

[8 of 24 positions shown; findings below may reference images not displayed]

ACR Breast Density Category b: There are scattered areas of
fibroglandular density.
FINDINGS: There are no findings suspicious for malignancy. Images were
processed with CAD.
IMPRESSION: No mammographic evidence of malignancy. A result letter of this
screening mammogram will be mailed directly to the patient.

RECOMMENDATION:
Screening mammogram in one year. (Code:[id number])

BI-RADS CATEGORY  1: Negative.
